# Patient Record
Sex: Male | Born: 1975
Health system: Southern US, Community
[De-identification: ages and names within clinical notes are randomized; demographics above are authoritative.]

## PROBLEM LIST (undated history)

## (undated) DIAGNOSIS — E785 Hyperlipidemia, unspecified: Secondary | ICD-10-CM

## (undated) DIAGNOSIS — A770 Spotted fever due to Rickettsia rickettsii: Secondary | ICD-10-CM

## (undated) DIAGNOSIS — D649 Anemia, unspecified: Secondary | ICD-10-CM

## (undated) DIAGNOSIS — M109 Gout, unspecified: Secondary | ICD-10-CM

## (undated) DIAGNOSIS — T7840XA Allergy, unspecified, initial encounter: Secondary | ICD-10-CM

## (undated) DIAGNOSIS — Z862 Personal history of diseases of the blood and blood-forming organs and certain disorders involving the immune mechanism: Secondary | ICD-10-CM

## (undated) DIAGNOSIS — R002 Palpitations: Secondary | ICD-10-CM

## (undated) HISTORY — DX: Palpitations: R00.2

## (undated) HISTORY — DX: Personal history of diseases of the blood and blood-forming organs and certain disorders involving the immune mechanism: Z86.2

## (undated) HISTORY — PX: KNEE SURGERY: SHX244

## (undated) HISTORY — PX: SHOULDER SURGERY: SHX246

## (undated) HISTORY — DX: Gout, unspecified: M10.9

## (undated) HISTORY — DX: Anemia, unspecified: D64.9

## (undated) HISTORY — PX: SPLENECTOMY: SUR1306

## (undated) HISTORY — DX: Allergy, unspecified, initial encounter: T78.40XA

---
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
## 2008-03-29 DIAGNOSIS — Z720 Tobacco use: Secondary | ICD-10-CM | POA: Insufficient documentation

## 2009-07-10 ENCOUNTER — Inpatient Hospital Stay: Payer: Self-pay | Admitting: Student

## 2009-08-01 DIAGNOSIS — E785 Hyperlipidemia, unspecified: Secondary | ICD-10-CM | POA: Insufficient documentation

## 2009-08-14 DIAGNOSIS — D693 Immune thrombocytopenic purpura: Secondary | ICD-10-CM | POA: Insufficient documentation

## 2010-02-20 ENCOUNTER — Ambulatory Visit: Payer: Self-pay

## 2011-03-04 ENCOUNTER — Ambulatory Visit: Payer: Self-pay | Admitting: Family Medicine

## 2011-06-27 ENCOUNTER — Ambulatory Visit: Payer: Self-pay | Admitting: Family Medicine

## 2011-08-26 LAB — CBC AND DIFFERENTIAL
HCT: 49 % (ref 41–53)
Hemoglobin: 17.5 g/dL (ref 13.5–17.5)
Neutrophils Absolute: 7 /uL
Platelets: 161 10*3/uL (ref 150–399)
WBC: 10.7 10^3/mL

## 2011-08-26 LAB — BASIC METABOLIC PANEL
BUN: 16 mg/dL (ref 4–21)
Creatinine: 1 mg/dL (ref 0.6–1.3)
Glucose: 98 mg/dL
Potassium: 4.9 mmol/L (ref 3.4–5.3)
Sodium: 138 mmol/L (ref 137–147)

## 2011-08-26 LAB — HEPATIC FUNCTION PANEL
ALT: 57 U/L — AB (ref 10–40)
AST: 35 U/L (ref 14–40)
Alkaline Phosphatase: 54 U/L (ref 25–125)
BILIRUBIN, TOTAL: 0.9 mg/dL

## 2011-08-26 LAB — TSH: TSH: 1.78 u[IU]/mL (ref 0.41–5.90)

## 2011-08-26 LAB — LIPID PANEL
Cholesterol: 266 mg/dL — AB (ref 0–200)
HDL: 48 mg/dL (ref 35–70)
LDL Cholesterol: 194 mg/dL
LDl/HDL Ratio: 4
Triglycerides: 119 mg/dL (ref 40–160)

## 2013-05-20 ENCOUNTER — Ambulatory Visit: Payer: Self-pay | Admitting: Specialist

## 2014-10-06 ENCOUNTER — Other Ambulatory Visit: Payer: Self-pay | Admitting: Family Medicine

## 2014-10-27 ENCOUNTER — Telehealth: Payer: Self-pay | Admitting: Family Medicine

## 2014-10-27 ENCOUNTER — Ambulatory Visit (INDEPENDENT_AMBULATORY_CARE_PROVIDER_SITE_OTHER): Payer: BLUE CROSS/BLUE SHIELD | Admitting: Family Medicine

## 2014-10-27 ENCOUNTER — Encounter: Payer: Self-pay | Admitting: Family Medicine

## 2014-10-27 VITALS — Temp 98.7°F

## 2014-10-27 DIAGNOSIS — Z9081 Acquired absence of spleen: Secondary | ICD-10-CM | POA: Insufficient documentation

## 2014-10-27 DIAGNOSIS — R05 Cough: Secondary | ICD-10-CM | POA: Insufficient documentation

## 2014-10-27 DIAGNOSIS — Z23 Encounter for immunization: Secondary | ICD-10-CM

## 2014-10-27 DIAGNOSIS — B029 Zoster without complications: Secondary | ICD-10-CM | POA: Insufficient documentation

## 2014-10-27 DIAGNOSIS — M25579 Pain in unspecified ankle and joints of unspecified foot: Secondary | ICD-10-CM | POA: Insufficient documentation

## 2014-10-27 DIAGNOSIS — D72829 Elevated white blood cell count, unspecified: Secondary | ICD-10-CM | POA: Insufficient documentation

## 2014-10-27 DIAGNOSIS — J189 Pneumonia, unspecified organism: Secondary | ICD-10-CM | POA: Insufficient documentation

## 2014-10-27 DIAGNOSIS — R053 Chronic cough: Secondary | ICD-10-CM | POA: Insufficient documentation

## 2014-10-27 DIAGNOSIS — G4700 Insomnia, unspecified: Secondary | ICD-10-CM | POA: Insufficient documentation

## 2014-10-27 DIAGNOSIS — M109 Gout, unspecified: Secondary | ICD-10-CM | POA: Insufficient documentation

## 2014-10-27 DIAGNOSIS — D649 Anemia, unspecified: Secondary | ICD-10-CM | POA: Insufficient documentation

## 2014-10-27 NOTE — Telephone Encounter (Signed)
Pt states per Dr Rosanna Randy he is needing to come in for 3 vaccines.  Pt is not sure of the name of the vaccines.  HT#[phone]

## 2014-10-27 NOTE — Telephone Encounter (Signed)
Pt advised need 2 meningitis immunizations and Prevnar. A year after Prevnar will need Pneumovax updated-aa

## 2014-11-08 NOTE — Telephone Encounter (Signed)
Mom wants to talk to [facility] about MMR's for the sons and has other questions about their vaccines..  Call back [phone]  [facility]

## 2014-11-08 NOTE — Telephone Encounter (Signed)
Mom is getting vaccine records from [place] central office.  Once we have them we will decide on vaccines needed.  ED

## 2014-11-28 ENCOUNTER — Ambulatory Visit: Payer: BLUE CROSS/BLUE SHIELD | Admitting: Family Medicine

## 2014-12-05 ENCOUNTER — Ambulatory Visit (INDEPENDENT_AMBULATORY_CARE_PROVIDER_SITE_OTHER): Payer: BLUE CROSS/BLUE SHIELD | Admitting: Family Medicine

## 2014-12-05 DIAGNOSIS — Z23 Encounter for immunization: Secondary | ICD-10-CM

## 2014-12-05 DIAGNOSIS — Q8901 Asplenia (congenital): Secondary | ICD-10-CM

## 2014-12-06 LAB — VARICELLA ZOSTER ABS, IGG/IGM: VARICELLA: 1825 {index} (ref >165)

## 2014-12-06 LAB — MEASLES/MUMPS/RUBELLA IMMUNITY: Rubella Antibodies, IGG: <0.9 index — ABNORMAL LOW (ref >0.99)

## 2014-12-13 ENCOUNTER — Telehealth: Payer: Self-pay | Admitting: Family Medicine

## 2014-12-13 NOTE — Telephone Encounter (Signed)
Pt is requesting the results from his blood work.  VO#[phone]

## 2014-12-13 NOTE — Telephone Encounter (Signed)
Please review, also on his brother Edison Nasuti Schnelle, same labs, thank you=-aa

## 2015-01-05 ENCOUNTER — Ambulatory Visit (INDEPENDENT_AMBULATORY_CARE_PROVIDER_SITE_OTHER): Payer: BLUE CROSS/BLUE SHIELD | Admitting: Family Medicine

## 2015-01-05 DIAGNOSIS — Z23 Encounter for immunization: Secondary | ICD-10-CM | POA: Diagnosis not present

## 2015-03-02 NOTE — Progress Notes (Signed)
Vaccine needed

## 2015-04-18 ENCOUNTER — Other Ambulatory Visit: Payer: Self-pay | Admitting: Rheumatology

## 2015-04-18 DIAGNOSIS — R945 Abnormal results of liver function studies: Principal | ICD-10-CM

## 2015-04-18 DIAGNOSIS — R7989 Other specified abnormal findings of blood chemistry: Secondary | ICD-10-CM

## 2015-04-28 ENCOUNTER — Ambulatory Visit
Admission: RE | Admit: 2015-04-28 | Discharge: 2015-04-28 | Disposition: A | Discharge status: Home / Self Care | Payer: BLUE CROSS/BLUE SHIELD | Source: Ambulatory Visit | Attending: Rheumatology | Admitting: Rheumatology

## 2015-04-28 DIAGNOSIS — R7989 Other specified abnormal findings of blood chemistry: Secondary | ICD-10-CM

## 2015-04-28 DIAGNOSIS — R945 Abnormal results of liver function studies: Principal | ICD-10-CM

## 2015-06-07 ENCOUNTER — Ambulatory Visit (INDEPENDENT_AMBULATORY_CARE_PROVIDER_SITE_OTHER): Payer: BLUE CROSS/BLUE SHIELD | Admitting: Family Medicine

## 2015-06-07 DIAGNOSIS — Z23 Encounter for immunization: Secondary | ICD-10-CM

## 2015-06-12 ENCOUNTER — Telehealth: Payer: Self-pay | Admitting: Family Medicine

## 2015-06-12 NOTE — Telephone Encounter (Signed)
Pt states he is calling [facility] back about the sleep study.  [id number]

## 2015-06-12 NOTE — Telephone Encounter (Signed)
Ryan Simmons He was checking to see if he could get the sleep study at home.  He said his insurance company seemed to be ok with it.  Can you get this set up and do I order it any differently than a regular one? Thanks [employer]

## 2015-06-15 NOTE — Telephone Encounter (Signed)
LMTCB

## 2015-06-15 NOTE — Telephone Encounter (Signed)
Pt advised that he will need to come in for office visit for documentation in order to to get sleep study approved

## 2015-06-20 ENCOUNTER — Encounter: Payer: Self-pay | Admitting: Family Medicine

## 2015-06-20 ENCOUNTER — Ambulatory Visit (INDEPENDENT_AMBULATORY_CARE_PROVIDER_SITE_OTHER): Payer: BLUE CROSS/BLUE SHIELD | Admitting: Family Medicine

## 2015-06-20 VITALS — BP 118/72 | HR 92 | Temp 98.1°F | Resp 16 | Wt 282.0 lb

## 2015-06-20 DIAGNOSIS — E669 Obesity, unspecified: Secondary | ICD-10-CM

## 2015-06-20 DIAGNOSIS — R5383 Other fatigue: Secondary | ICD-10-CM | POA: Diagnosis not present

## 2015-06-20 DIAGNOSIS — R0689 Other abnormalities of breathing: Secondary | ICD-10-CM | POA: Diagnosis not present

## 2015-06-20 DIAGNOSIS — Q8901 Asplenia (congenital): Secondary | ICD-10-CM

## 2015-06-20 DIAGNOSIS — G4730 Sleep apnea, unspecified: Secondary | ICD-10-CM | POA: Diagnosis not present

## 2015-06-20 NOTE — Progress Notes (Signed)
Patient ID: Ryan Simmons, male   DOB: Oct 30, 1975, 40 y.o.   MRN: [id number]    Subjective:  HPI  Patient is here to discuss getting referred for sleep study. His Epworth score on may 3rd was 11. He has never had a sleep study. His current symptoms that he knows off are: tired/fatigue all the time, feels sleepy all day, does not feel restful after nights sleep and wakes up gasping for air sometimes when he falls asleep in recliner.  Prior to Admission medications   Medication Sig Start Date End Date Taking? Authorizing Provider  ULORIC 80 MG TABS  05/05/15  Yes Historical Provider, MD    Patient Active Problem List   Diagnosis Date Noted  . Absolute anemia 10/27/2014  . Ankle pain 10/27/2014  . Pneumonia, organism unspecified 10/27/2014  . Cough, persistent 10/27/2014  . Gout 10/27/2014  . H/O splenectomy 10/27/2014  . Insomnia 10/27/2014  . Leukocytosis 10/27/2014  . Shingles 10/27/2014  . Immune thrombocytopenic purpura ([place]) 08/14/2009  . HLD (hyperlipidemia) 08/01/2009  . Current tobacco use 03/29/2008    Past Medical History  Diagnosis Date  . Anemia   . Allergy   . Gout     Social History   Social History  . Marital Status: Single    Spouse Name: N/A  . Number of Children: N/A  . Years of Education: N/A   Occupational History  . Not on file.   Social History Main Topics  . Smoking status: Never Smoker   . Smokeless tobacco: Current User    Types: Chew  . Alcohol Use: 0.0 oz/week    0 Standard drinks or equivalent per week     Comment: ocassionally   . Drug Use: No  . Sexual Activity: Not on file   Other Topics Concern  . Not on file   Social History Narrative    No Known Allergies  Review of Systems  Constitutional: Positive for malaise/fatigue.  HENT: Positive for congestion.   Respiratory: Positive for shortness of breath.   Cardiovascular: Negative.   Gastrointestinal: Negative.   Musculoskeletal: Negative.   Neurological: Negative.     Endo/Heme/Allergies: Negative.   Psychiatric/Behavioral: Negative.     Immunization History  Administered Date(s) Administered  . DTaP 07/15/1975, 01/02/1976, 07/20/1976, 01/23/1977  . Hepatitis A 12/05/2014  . Hepatitis A, Adult 06/07/2015  . Hepatitis B 12/05/2014  . Hepatitis B, adult 01/05/2015, 06/07/2015  . IPV 07/15/1975, 11/01/1975, 01/23/1977, 09/06/1980  . Influenza,inj,Quad PF,36+ Mos 10/27/2014  . MMR 10/02/1976  . Meningococcal B, OMV 10/27/2014, 12/05/2014  . Meningococcal Conjugate 07/12/2009, 10/27/2014  . Pneumococcal Conjugate-13 10/27/2014  . Pneumococcal Polysaccharide-23 02/18/2005, 01/05/2015  . Td 09/06/1980  . Tdap 08/29/2011   Objective:  BP 118/72 mmHg  Pulse 92  Temp(Src) 98.1 F (36.7 C)  Resp 16  Wt 282 lb (127.914 kg)  Physical Exam  Constitutional: He is oriented to person, place, and time and well-developed, well-nourished, and in no distress.  HENT:  Head: Normocephalic and atraumatic.  Left Ear: External ear normal.  Nose: Nose normal.  Mouth/Throat: Oropharynx is clear and moist.  Eyes: Conjunctivae are normal. Pupils are equal, round, and reactive to light.  Neck: Normal range of motion. Neck supple.  Cardiovascular: Normal rate, regular rhythm, normal heart sounds and intact distal pulses.   No murmur heard. Pulmonary/Chest: Effort normal and breath sounds normal. No respiratory distress. He has no wheezes.  Abdominal: Soft.  Neurological: He is alert and oriented to person, place, and time.  Gait normal.  Skin: Skin is warm and dry.  Psychiatric: Mood, memory, affect and judgment normal.      Assessment and Plan :  1. Other fatigue Epworth score is 11. Discussed benefits of doing sleep study and using CPAP machine if he has sleep apnea. Discussed 2 options of how to order this study-new way available where patient does this at home by himself and he needs tcheck with insurance for coverage, other option referring to sleep study  place and they will call patient and get all the details worked out. Patient wants to do home sleep study at this time.  2. Gasping for breath At times after falling asleep in recliner. Discussed with patient that sinus issue could be causing the symptoms he is having. Discussed option of seen Dr. Gerarda Gunther for further plan of care.  3.Obesity Work on habits. 4.Asplenic I have done the exam and reviewed the above chart and it is accurate to the best of my knowledge.  Patient was seen and examined by Dr. Eulas Post and note was scribed by Theressa Millard, RMA.    Miguel Aschoff MD [facility] 06/20/2015 2:23 PM

## 2015-06-30 DIAGNOSIS — L570 Actinic keratosis: Secondary | ICD-10-CM | POA: Diagnosis not present

## 2015-06-30 DIAGNOSIS — L718 Other rosacea: Secondary | ICD-10-CM | POA: Diagnosis not present

## 2015-07-11 ENCOUNTER — Ambulatory Visit (INDEPENDENT_AMBULATORY_CARE_PROVIDER_SITE_OTHER): Payer: BLUE CROSS/BLUE SHIELD | Admitting: Family Medicine

## 2015-07-11 ENCOUNTER — Encounter: Payer: Self-pay | Admitting: Family Medicine

## 2015-07-11 VITALS — BP 122/76 | HR 76 | Temp 97.8°F | Resp 16 | Ht 71.0 in | Wt 284.0 lb

## 2015-07-11 DIAGNOSIS — R5383 Other fatigue: Secondary | ICD-10-CM | POA: Diagnosis not present

## 2015-07-11 DIAGNOSIS — Q8901 Asplenia (congenital): Secondary | ICD-10-CM | POA: Diagnosis not present

## 2015-07-11 NOTE — Progress Notes (Signed)
Patient ID: Ryan Simmons, male   DOB: [birth year], 40 y.o.   MRN: [id number]    Subjective:  HPI Pt is here for a 1 month follow up of fatigue immunizations. Pt reports that he is still feeling tired. He had his sleep study at home and we have not received any results. He is needing a MMR vaccine today due to splenectomy.     Prior to Admission medications   Medication Sig Start Date End Date Taking? Authorizing Provider  ULORIC 80 MG TABS  05/05/15  Yes Historical Provider, MD    Patient Active Problem List   Diagnosis Date Noted  . Absolute anemia 10/27/2014  . Ankle pain 10/27/2014  . Pneumonia, organism unspecified 10/27/2014  . Cough, persistent 10/27/2014  . Gout 10/27/2014  . H/O splenectomy 10/27/2014  . Insomnia 10/27/2014  . Leukocytosis 10/27/2014  . Shingles 10/27/2014  . Immune thrombocytopenic purpura ([place]) 08/14/2009  . HLD (hyperlipidemia) 08/01/2009  . Current tobacco use 03/29/2008    Past Medical History  Diagnosis Date  . Anemia   . Allergy   . Gout     Social History   Social History  . Marital Status: Single    Spouse Name: N/A  . Number of Children: N/A  . Years of Education: N/A   Occupational History  . Not on file.   Social History Main Topics  . Smoking status: Never Smoker   . Smokeless tobacco: Current User    Types: Chew  . Alcohol Use: 0.0 oz/week    0 Standard drinks or equivalent per week     Comment: ocassionally   . Drug Use: No  . Sexual Activity: Not on file   Other Topics Concern  . Not on file   Social History Narrative    No Known Allergies  Review of Systems  Constitutional: Positive for malaise/fatigue.  HENT: Negative.   Eyes: Negative.   Respiratory: Negative.   Cardiovascular: Negative.   Gastrointestinal: Negative.   Genitourinary: Negative.   Musculoskeletal: Negative.   Skin: Negative.   Neurological: Negative.   Endo/Heme/Allergies: Negative.   Psychiatric/Behavioral: Negative.      Immunization History  Administered Date(s) Administered  . DTaP 07/15/1975, 01/02/1976, 07/20/1976, 01/23/1977  . Hepatitis A 12/05/2014  . Hepatitis A, Adult 06/07/2015  . Hepatitis B 12/05/2014  . Hepatitis B, adult 01/05/2015, 06/07/2015  . IPV 07/15/1975, 11/01/1975, 01/23/1977, 09/06/1980  . Influenza,inj,Quad PF,36+ Mos 10/27/2014  . MMR 10/02/1976  . Meningococcal B, OMV 10/27/2014, 12/05/2014  . Meningococcal Conjugate 07/12/2009, 10/27/2014  . Pneumococcal Conjugate-13 10/27/2014  . Pneumococcal Polysaccharide-23 02/18/2005, 01/05/2015  . Td 09/06/1980  . Tdap 08/29/2011   Objective:  BP 122/76 mmHg  Pulse 76  Temp(Src) 97.8 F (36.6 C) (Oral)  Resp 16  Ht '5\' 11"'$  (1.803 m)  Wt 284 lb (128.822 kg)  BMI 39.63 kg/m2  Physical Exam  Constitutional: He is oriented to person, place, and time and well-developed, well-nourished, and in no distress.  Eyes: Conjunctivae and EOM are normal. Pupils are equal, round, and reactive to light.  Neck: Normal range of motion. Neck supple.  Cardiovascular: Normal rate, regular rhythm, normal heart sounds and intact distal pulses.   Pulmonary/Chest: Effort normal and breath sounds normal.  Musculoskeletal: Normal range of motion.  Neurological: He is alert and oriented to person, place, and time. He has normal reflexes. Gait normal. GCS score is 15.  Skin: Skin is warm and dry.  Psychiatric: Mood, memory, affect and judgment normal.  Lab Results  Component Value Date   WBC 10.7 08/26/2011   HGB 17.5 08/26/2011   HCT 49 08/26/2011   PLT 161 08/26/2011   CHOL 266* 08/26/2011   TRIG 119 08/26/2011   HDL 48 08/26/2011   LDLCALC 194 08/26/2011   TSH 1.78 08/26/2011    CMP     Component Value Date/Time   NA 138 08/26/2011   K 4.9 08/26/2011   BUN 16 08/26/2011   CREATININE 1.0 08/26/2011   AST 35 08/26/2011   ALT 57* 08/26/2011   ALKPHOS 54 08/26/2011    Assessment and Plan :  1. Other fatigue   2.  Asplenia  - MMR vaccine subcutaneous; Standing - MMR vaccine subcutaneous 3. Sleep apnea Sleep study pending. Will treat as soon as sleep study is obtained  Patient was seen and examined by Dr. Miguel Aschoff, and noted scribed by Webb Laws, [place] MD [facility] 07/11/2015 4:19 PM

## 2015-08-11 NOTE — Progress Notes (Signed)
Vaccination update

## 2015-08-11 NOTE — Progress Notes (Signed)
Pt reviewed for clinical indication for vaccine.

## 2015-08-30 NOTE — Progress Notes (Signed)
Vaccine for asplenic pt.

## 2015-10-11 DIAGNOSIS — M1A09X0 Idiopathic chronic gout, multiple sites, without tophus (tophi): Secondary | ICD-10-CM | POA: Diagnosis not present

## 2015-10-11 DIAGNOSIS — Z79899 Other long term (current) drug therapy: Secondary | ICD-10-CM | POA: Diagnosis not present

## 2015-12-12 DIAGNOSIS — Z23 Encounter for immunization: Secondary | ICD-10-CM | POA: Diagnosis not present

## 2015-12-15 DIAGNOSIS — R0981 Nasal congestion: Secondary | ICD-10-CM | POA: Diagnosis not present

## 2015-12-15 DIAGNOSIS — J0100 Acute maxillary sinusitis, unspecified: Secondary | ICD-10-CM | POA: Diagnosis not present

## 2015-12-15 DIAGNOSIS — H6980 Other specified disorders of Eustachian tube, unspecified ear: Secondary | ICD-10-CM | POA: Diagnosis not present

## 2015-12-15 DIAGNOSIS — J301 Allergic rhinitis due to pollen: Secondary | ICD-10-CM | POA: Diagnosis not present

## 2015-12-25 DIAGNOSIS — J301 Allergic rhinitis due to pollen: Secondary | ICD-10-CM | POA: Diagnosis not present

## 2015-12-25 DIAGNOSIS — J342 Deviated nasal septum: Secondary | ICD-10-CM | POA: Diagnosis not present

## 2015-12-25 DIAGNOSIS — R0981 Nasal congestion: Secondary | ICD-10-CM | POA: Diagnosis not present

## 2016-03-13 ENCOUNTER — Telehealth: Payer: Self-pay | Admitting: Family Medicine

## 2016-03-13 NOTE — Telephone Encounter (Signed)
Pt's mom [name] would like [name] to return her call. [name] is concerned with pt's immune system she would like to have an Rx for Tamaflu sent to [facility] just in case. [name] stated she would just feel better if there where to start showing signs the Rx would be at the pharmacy in case it was after clinic hours or over the weekend. Please advise. Thanks TNP

## 2016-03-13 NOTE — Telephone Encounter (Signed)
Patient has not been exposed or having any symptoms, mother was just concerned and asking questions.   ED

## 2016-05-23 ENCOUNTER — Ambulatory Visit (INDEPENDENT_AMBULATORY_CARE_PROVIDER_SITE_OTHER): Payer: BLUE CROSS/BLUE SHIELD | Admitting: Family Medicine

## 2016-05-23 ENCOUNTER — Encounter: Payer: Self-pay | Admitting: Family Medicine

## 2016-05-23 VITALS — BP 122/74 | HR 92 | Temp 98.3°F | Resp 16 | Ht 73.0 in | Wt 281.0 lb

## 2016-05-23 DIAGNOSIS — Z9081 Acquired absence of spleen: Secondary | ICD-10-CM | POA: Diagnosis not present

## 2016-05-23 DIAGNOSIS — M109 Gout, unspecified: Secondary | ICD-10-CM

## 2016-05-23 DIAGNOSIS — Z23 Encounter for immunization: Secondary | ICD-10-CM

## 2016-05-23 DIAGNOSIS — Z0000 Encounter for general adult medical examination without abnormal findings: Secondary | ICD-10-CM

## 2016-05-23 LAB — POCT URINALYSIS DIPSTICK
Bilirubin, UA: NEGATIVE
Blood, UA: NEGATIVE
Glucose, UA: NEGATIVE
Ketones, UA: NEGATIVE
Leukocytes, UA: NEGATIVE
Nitrite, UA: NEGATIVE
Protein, UA: NEGATIVE
Spec Grav, UA: 1.01
Urobilinogen, UA: 0.2 U/dL
pH, UA: 6

## 2016-05-23 NOTE — Progress Notes (Signed)
Patient: Ryan Simmons, Male    DOB: [birth year], 41 y.o.   MRN: [id number] Visit Date: 05/23/2016  Today's Provider: Wilhemena Durie, MD   Chief Complaint  Patient presents with  . Annual Exam   Subjective:    Annual physical exam Ryan Simmons is a 41 y.o. male who presents today for health maintenance and complete physical. He feels well. He reports exercising about twice a week for 20 minutes. Walks. He reports he is sleeping well.  Pt also has a H/O splenectomy, and needs his hib vaccine today. Pt needs routine labs today. -----------------------------------------------------------------   Review of Systems  Constitutional: Negative.   HENT: Negative.   Eyes: Negative.   Respiratory: Negative.   Cardiovascular: Negative.   Gastrointestinal: Negative.   Endocrine: Negative.   Genitourinary: Negative.   Musculoskeletal: Positive for arthralgias and back pain. Negative for gait problem, joint swelling, myalgias, neck pain and neck stiffness.  Skin: Negative.   Allergic/Immunologic: Negative.   Neurological: Negative.   Hematological: Negative.   Psychiatric/Behavioral: Negative.     Social History      He  reports that he has never smoked. His smokeless tobacco use includes Chew. He reports that he drinks alcohol. He reports that he does not use drugs.       Social History   Social History  . Marital status: Single    Spouse name: N/A  . Number of children: 0  . Years of education: bachelors   Occupational History  .  [employer]   Social History Main Topics  . Smoking status: Never Smoker  . Smokeless tobacco: Current User    Types: Chew  . Alcohol use 0.0 oz/week     Comment: ocassionally   . Drug use: No  . Sexual activity: Yes   Other Topics Concern  . None   Social History Narrative  . None    Past Medical History:  Diagnosis Date  . Allergy   . Anemia   . Gout      Patient Active Problem List   Diagnosis Date  Noted  . Absolute anemia 10/27/2014  . Ankle pain 10/27/2014  . Pneumonia, organism unspecified(486) 10/27/2014  . Cough, persistent 10/27/2014  . Gout 10/27/2014  . H/O splenectomy 10/27/2014  . Insomnia 10/27/2014  . Leukocytosis 10/27/2014  . Shingles 10/27/2014  . Immune thrombocytopenic purpura ([place]) 08/14/2009  . HLD (hyperlipidemia) 08/01/2009  . Current tobacco use 03/29/2008    Past Surgical History:  Procedure Laterality Date  . KNEE SURGERY Left   . SHOULDER SURGERY Right   . SPLENECTOMY      Family History        Family Status  Relation Status  . Father Alive  . Mother Alive  . Brother Alive        His family history includes Gout in his father; Healthy in his brother and mother.     No Known Allergies   Current Outpatient Prescriptions:  .  ULORIC 80 MG TABS, , Disp: , Rfl: 0   Patient Care Team: Jerrol Banana., MD as PCP - General (Family Medicine)      Objective:   Vitals: BP 122/74 (BP Location: Right Arm, Patient Position: Sitting, Cuff Size: Large)   Pulse 92   Temp 98.3 F (36.8 C) (Oral)   Resp 16   Ht _0  (1.854 m)   Wt 281 lb (127.5 kg)   BMI 37.07 kg/m  Vitals:   05/23/16 0925  BP: 122/74  Pulse: 92  Resp: 16  Temp: 98.3 F (36.8 C)  TempSrc: Oral  Weight: 281 lb (127.5 kg)  Height: _0  (1.854 m)     Physical Exam  Constitutional: He is oriented to person, place, and time. He appears well-developed and well-nourished.  HENT:  Head: Normocephalic and atraumatic.  Right Ear: External ear normal.  Left Ear: External ear normal.  Nose: Nose normal.  Mouth/Throat: Oropharynx is clear and moist. No oropharyngeal exudate.  Tonsils enlarged--2+.  Eyes: Conjunctivae are normal. Pupils are equal, round, and reactive to light. Right eye exhibits no discharge. Left eye exhibits no discharge.  Neck: Normal range of motion. Neck supple. No thyromegaly present.  Cardiovascular: Normal rate, regular rhythm and normal  heart sounds.   Pulmonary/Chest: Effort normal. No respiratory distress.  Abdominal: Soft. There is no tenderness.  Genitourinary: Penis normal.  Genitourinary Comments: DRE refused.  Musculoskeletal: Normal range of motion. He exhibits no edema.  Lymphadenopathy:    He has no cervical adenopathy.  Neurological: He is alert and oriented to person, place, and time. He has normal reflexes.  Skin: Skin is warm and dry. No rash noted. He is not diaphoretic. No erythema. No pallor.  Psychiatric: He has a normal mood and affect. His behavior is normal. Judgment and thought content normal.     Depression Screen PHQ 2/9 Scores 05/23/2016  PHQ - 2 Score 0  PHQ- 9 Score 0      Assessment & Plan:     Routine Health Maintenance and Physical Exam  Exercise Activities and Dietary recommendations Goals    None      Immunization History  Administered Date(s) Administered  . DTaP 07/15/1975, 01/02/1976, 07/20/1976, 01/23/1977  . Hepatitis A 12/05/2014  . Hepatitis A, Adult 06/07/2015  . Hepatitis B 12/05/2014  . Hepatitis B, adult 01/05/2015, 06/07/2015  . HiB (PRP-T) 05/23/2016  . IPV 07/15/1975, 11/01/1975, 01/23/1977, 09/06/1980  . Influenza,inj,Quad PF,36+ Mos 10/27/2014  . MMR 10/02/1976, 07/11/2015  . Meningococcal B, OMV 10/27/2014, 12/05/2014  . Meningococcal Conjugate 07/12/2009, 10/27/2014  . Pneumococcal Conjugate-13 10/27/2014  . Pneumococcal Polysaccharide-23 02/18/2005, 01/05/2015  . Td 09/06/1980  . Tdap 08/29/2011    Health Maintenance  Topic Date Due  . HIV Screening  05/13/1990  . INFLUENZA VACCINE  09/04/2016  . TETANUS/TDAP  08/28/2021     Discussed health benefits of physical activity, and encouraged him to engage in regular exercise appropriate for his age and condition.  Mild Obesity s/p Splenectomy for ITP Gout Check Uric Acid.    --------------------------------------------------------------------  Patient seen and examined by Miguel Aschoff, MD, and note scribed by Renaldo Fiddler, CMA. I have done the exam and reviewed the above chart and it is accurate to the best of my knowledge. Development worker, community has been used in this note in any air is in the dictation or transcription are unintentional.  Wilhemena Durie, MD  [place]

## 2016-05-24 LAB — CBC WITH DIFFERENTIAL/PLATELET
Basophils Absolute: 0.1 10*3/uL (ref 0.0–0.2)
Basos: 1 %
EOS (ABSOLUTE): 0.1 10*3/uL (ref 0.0–0.4)
EOS: 2 %
HEMATOCRIT: 46.8 % (ref 37.5–51.0)
HEMOGLOBIN: 16.5 g/dL (ref 13.0–17.7)
IMMATURE GRANS (ABS): 0 10*3/uL (ref 0.0–0.1)
Immature Granulocytes: 0 %
LYMPHS ABS: 2 10*3/uL (ref 0.7–3.1)
LYMPHS: 27 %
MCH: 28.9 pg (ref 26.6–33.0)
MCHC: 35.3 g/dL (ref 31.5–35.7)
MCV: 82 fL (ref 79–97)
MONOCYTES: 9 %
Monocytes Absolute: 0.7 10*3/uL (ref 0.1–0.9)
NEUTROS ABS: 4.6 10*3/uL (ref 1.4–7.0)
Neutrophils: 61 %
Platelets: 142 10*3/uL — ABNORMAL LOW (ref 150–379)
RBC: 5.71 x10E6/uL (ref 4.14–5.80)
RDW: 14.4 % (ref 12.3–15.4)
WBC: 7.4 10*3/uL (ref 3.4–10.8)

## 2016-05-24 LAB — COMPREHENSIVE METABOLIC PANEL
ALBUMIN: 4.8 g/dL (ref 3.5–5.5)
ALK PHOS: 62 IU/L (ref 39–117)
ALT: 34 IU/L (ref 0–44)
AST: 24 IU/L (ref 0–40)
Albumin/Globulin Ratio: 1.8 (ref 1.2–2.2)
BILIRUBIN TOTAL: 0.7 mg/dL (ref 0.0–1.2)
BUN / CREAT RATIO: 12 (ref 9–20)
BUN: 12 mg/dL (ref 6–24)
CO2: 23 mmol/L (ref 18–29)
CREATININE: 0.99 mg/dL (ref 0.76–1.27)
Calcium: 10 mg/dL (ref 8.7–10.2)
Chloride: 100 mmol/L (ref 96–106)
GFR calc non Af Amer: 94 mL/min/{1.73_m2} (ref >59)
GFR, EST AFRICAN AMERICAN: 109 mL/min/{1.73_m2} (ref >59)
GLOBULIN, TOTAL: 2.7 g/dL (ref 1.5–4.5)
GLUCOSE: 105 mg/dL — AB (ref 65–99)
Potassium: 4.6 mmol/L (ref 3.5–5.2)
SODIUM: 141 mmol/L (ref 134–144)
TOTAL PROTEIN: 7.5 g/dL (ref 6.0–8.5)

## 2016-05-24 LAB — LIPID PANEL
Chol/HDL Ratio: 9.4 ratio — ABNORMAL HIGH (ref 0.0–5.0)
Cholesterol, Total: 272 mg/dL — ABNORMAL HIGH (ref 100–199)
HDL: 29 mg/dL — AB (ref >39)
LDL Calculated: 196 mg/dL — ABNORMAL HIGH (ref 0–99)
TRIGLYCERIDES: 236 mg/dL — AB (ref 0–149)
VLDL Cholesterol Cal: 47 mg/dL — ABNORMAL HIGH (ref 5–40)

## 2016-05-24 LAB — TSH: TSH: 2.3 u[IU]/mL (ref 0.450–4.500)

## 2016-05-24 LAB — PSA: PROSTATE SPECIFIC AG, SERUM: 0.5 ng/mL (ref 0.0–4.0)

## 2016-05-24 LAB — URIC ACID: Uric Acid: 7.7 mg/dL (ref 3.7–8.6)

## 2016-06-03 ENCOUNTER — Telehealth: Payer: Self-pay

## 2016-06-03 MED ORDER — ROSUVASTATIN CALCIUM 10 MG PO TABS: 10.0000 mg | ORAL_TABLET | Freq: Every day | ORAL | 3 refills | Status: DC

## 2016-06-03 NOTE — Telephone Encounter (Signed)
-----   Message from Jerrol Banana., MD sent at 06/03/2016  3:23 PM EDT ----- Labs ok but lipids too high--would start c/restor 10mg  dailt--RTC 1 month

## 2016-06-03 NOTE — Telephone Encounter (Signed)
Patient advised. RX sent in to [facility] and appointment made for follow up-aa

## 2016-07-02 ENCOUNTER — Encounter: Payer: Self-pay | Admitting: Family Medicine

## 2016-07-02 ENCOUNTER — Ambulatory Visit (INDEPENDENT_AMBULATORY_CARE_PROVIDER_SITE_OTHER): Payer: BLUE CROSS/BLUE SHIELD | Admitting: Family Medicine

## 2016-07-02 VITALS — BP 122/72 | HR 82 | Temp 98.6°F | Resp 16 | Wt 286.0 lb

## 2016-07-02 DIAGNOSIS — E7800 Pure hypercholesterolemia, unspecified: Secondary | ICD-10-CM

## 2016-07-02 NOTE — Progress Notes (Signed)
Subjective:  HPI  Lipid/Cholesterol, Follow-up:   Last seen for this1 months ago.  Management changes since that visit include started crestor. . Last Lipid Panel:    Component Value Date/Time   CHOL 272 (H) 05/23/2016 1043   TRIG 236 (H) 05/23/2016 1043   HDL 29 (L) 05/23/2016 1043   CHOLHDL 9.4 (H) 05/23/2016 1043   LDLCALC 196 (H) 05/23/2016 1043    Risk factors for vascular disease include hypercholesterolemia He has no side effects with medication. He reports good compliance with treatment. He is not having side effects.  Current exercise: none  Wt Readings from Last 3 Encounters:  07/02/16 286 lb (129.7 kg)  05/23/16 281 lb (127.5 kg)  07/11/15 284 lb (128.8 kg)   ------------------------------------------------------------------- Pt reports that he his doing well on Crestor. He is due for a recheck of lipids.   Prior to Admission medications   Medication Sig Start Date End Date Taking? Authorizing Provider  rosuvastatin (CRESTOR) 10 MG tablet Take 1 tablet (10 mg total) by mouth at bedtime. 06/03/16   Jerrol Banana., MD  ULORIC 80 MG TABS  05/05/15   [provider]    Patient Active Problem List   Diagnosis Date Noted  . Absolute anemia 10/27/2014  . Ankle pain 10/27/2014  . Pneumonia, organism unspecified(486) 10/27/2014  . Cough, persistent 10/27/2014  . Gout 10/27/2014  . H/O splenectomy 10/27/2014  . Insomnia 10/27/2014  . Leukocytosis 10/27/2014  . Shingles 10/27/2014  . Immune thrombocytopenic purpura ([place]) 08/14/2009  . HLD (hyperlipidemia) 08/01/2009  . Current tobacco use 03/29/2008    Past Medical History:  Diagnosis Date  . Allergy   . Anemia   . Gout     Social History   Social History  . Marital status: Single    Spouse name: N/A  . Number of children: 0  . Years of education: bachelors   Occupational History  .  [employer]   Social History Main Topics  . Smoking status: Never Smoker  . Smokeless  tobacco: Current User    Types: Chew  . Alcohol use 0.0 oz/week     Comment: ocassionally   . Drug use: No  . Sexual activity: Yes   Other Topics Concern  . Not on file   Social History Narrative  . No narrative on file    No Known Allergies  Review of Systems  Constitutional: Negative.   HENT: Negative.   Eyes: Negative.   Respiratory: Negative.   Cardiovascular: Negative.   Gastrointestinal: Negative.   Genitourinary: Negative.   Musculoskeletal: Negative.   Skin: Negative.   Neurological: Negative.   Endo/Heme/Allergies: Negative.   Psychiatric/Behavioral: Negative.     Immunization History  Administered Date(s) Administered  . DTaP 07/15/1975, 01/02/1976, 07/20/1976, 01/23/1977  . Hepatitis A 12/05/2014  . Hepatitis A, Adult 06/07/2015  . Hepatitis B 12/05/2014  . Hepatitis B, adult 01/05/2015, 06/07/2015  . HiB (PRP-T) 05/23/2016  . IPV 07/15/1975, 11/01/1975, 01/23/1977, 09/06/1980  . Influenza,inj,Quad PF,36+ Mos 10/27/2014  . MMR 10/02/1976, 07/11/2015  . Meningococcal B, OMV 10/27/2014, 12/05/2014  . Meningococcal Conjugate 07/12/2009, 10/27/2014  . Pneumococcal Conjugate-13 10/27/2014  . Pneumococcal Polysaccharide-23 02/18/2005, 01/05/2015  . Td 09/06/1980  . Tdap 08/29/2011    Objective:  BP 122/72 (BP Location: Right Arm, Patient Position: Sitting, Cuff Size: Large)   Pulse 82   Temp 98.6 F (37 C) (Oral)   Resp 16   Wt 286 lb (129.7 kg)   BMI 37.73 kg/m  Physical Exam  Constitutional: He is oriented to person, place, and time and well-developed, well-nourished, and in no distress.  HENT:  Head: Normocephalic and atraumatic.  Eyes: Conjunctivae are normal. No scleral icterus.  Neck: No thyromegaly present.  Cardiovascular: Normal rate, regular rhythm and normal heart sounds.   Pulmonary/Chest: Effort normal and breath sounds normal.  Neurological: He is oriented to person, place, and time. Gait normal. GCS score is 15.  Skin: Skin is  warm and dry.  Psychiatric: Mood, memory, affect and judgment normal.    Lab Results  Component Value Date   WBC 7.4 05/23/2016   HGB 17.5 08/26/2011   HCT 46.8 05/23/2016   PLT 142 (L) 05/23/2016   GLUCOSE 105 (H) 05/23/2016   CHOL 272 (H) 05/23/2016   TRIG 236 (H) 05/23/2016   HDL 29 (L) 05/23/2016   LDLCALC 196 (H) 05/23/2016   TSH 2.300 05/23/2016    CMP     Component Value Date/Time   NA 141 05/23/2016 1043   K 4.6 05/23/2016 1043   CL 100 05/23/2016 1043   CO2 23 05/23/2016 1043   GLUCOSE 105 (H) 05/23/2016 1043   BUN 12 05/23/2016 1043   CREATININE 0.99 05/23/2016 1043   CALCIUM 10.0 05/23/2016 1043   PROT 7.5 05/23/2016 1043   ALBUMIN 4.8 05/23/2016 1043   AST 24 05/23/2016 1043   ALT 34 05/23/2016 1043   ALKPHOS 62 05/23/2016 1043   BILITOT 0.7 05/23/2016 1043   GFRNONAA 94 05/23/2016 1043   GFRAA 109 05/23/2016 1043    Assessment and Plan :  1. Pure hypercholesterolemia  - Lipid Panel With LDL/HDL Ratio - Hepatic function panel - CK 2.Gout  I have done the exam and reviewed the chart and it is accurate to the best of my knowledge. Development worker, community has been used and  any errors in dictation or transcription are unintentional. Miguel Aschoff M.D. [place] MD [facility] 07/02/2016 8:42 AM

## 2016-07-03 LAB — HEPATIC FUNCTION PANEL
ALT: 48 [IU]/L — ABNORMAL HIGH (ref 0–44)
AST: 33 [IU]/L (ref 0–40)
Albumin: 4.2 g/dL (ref 3.5–5.5)
Alkaline Phosphatase: 59 [IU]/L (ref 39–117)
Bilirubin Total: 0.5 mg/dL (ref 0.0–1.2)
Bilirubin, Direct: 0.15 mg/dL (ref 0.00–0.40)
Total Protein: 6.8 g/dL (ref 6.0–8.5)

## 2016-07-03 LAB — CK: Total CK: 178 U/L (ref 24–204)

## 2016-07-03 LAB — LIPID PANEL WITH LDL/HDL RATIO
Cholesterol, Total: 198 mg/dL (ref 100–199)
HDL: 20 mg/dL — ABNORMAL LOW
Triglycerides: 1152 mg/dL (ref 0–149)

## 2016-07-05 ENCOUNTER — Telehealth: Payer: Self-pay

## 2016-07-05 DIAGNOSIS — E7849 Other hyperlipidemia: Secondary | ICD-10-CM

## 2016-07-05 MED ORDER — OMEGA-3-ACID ETHYL ESTERS 1 G PO CAPS: ORAL_CAPSULE | ORAL | 12 refills | Status: DC

## 2016-07-05 NOTE — Telephone Encounter (Signed)
-----   Message from Jerrol Banana., MD sent at 07/03/2016  9:06 AM EDT ----- Triglycerides way too high if this was fasting lab work. Recommend generic Fish oil 1 g, 4 daily. #120 with 12 refills. Repeat lipids one month

## 2016-07-05 NOTE — Telephone Encounter (Signed)
Pt advised, RX sent in and lab slip placed up front-aa

## 2016-10-10 DIAGNOSIS — Z79899 Other long term (current) drug therapy: Secondary | ICD-10-CM | POA: Diagnosis not present

## 2016-10-10 DIAGNOSIS — M1A09X0 Idiopathic chronic gout, multiple sites, without tophus (tophi): Secondary | ICD-10-CM | POA: Diagnosis not present

## 2016-10-10 DIAGNOSIS — K760 Fatty (change of) liver, not elsewhere classified: Secondary | ICD-10-CM | POA: Diagnosis not present

## 2016-10-30 DIAGNOSIS — L570 Actinic keratosis: Secondary | ICD-10-CM | POA: Diagnosis not present

## 2016-10-30 DIAGNOSIS — L820 Inflamed seborrheic keratosis: Secondary | ICD-10-CM | POA: Diagnosis not present

## 2016-12-09 ENCOUNTER — Ambulatory Visit: Payer: BLUE CROSS/BLUE SHIELD | Admitting: Family Medicine

## 2016-12-10 ENCOUNTER — Ambulatory Visit: Payer: BLUE CROSS/BLUE SHIELD | Admitting: Family Medicine

## 2016-12-10 VITALS — BP 132/70 | HR 84 | Temp 98.1°F | Resp 18 | Wt 284.0 lb

## 2016-12-10 DIAGNOSIS — Z6837 Body mass index (BMI) 37.0-37.9, adult: Secondary | ICD-10-CM | POA: Diagnosis not present

## 2016-12-10 DIAGNOSIS — Z9081 Acquired absence of spleen: Secondary | ICD-10-CM

## 2016-12-10 DIAGNOSIS — M791 Myalgia, unspecified site: Secondary | ICD-10-CM | POA: Diagnosis not present

## 2016-12-10 DIAGNOSIS — R739 Hyperglycemia, unspecified: Secondary | ICD-10-CM

## 2016-12-10 DIAGNOSIS — M109 Gout, unspecified: Secondary | ICD-10-CM | POA: Diagnosis not present

## 2016-12-10 DIAGNOSIS — E7849 Other hyperlipidemia: Secondary | ICD-10-CM

## 2016-12-10 DIAGNOSIS — Z23 Encounter for immunization: Secondary | ICD-10-CM | POA: Diagnosis not present

## 2016-12-10 MED ORDER — MAGNESIUM OXIDE 400 MG PO TABS: 400.0000 mg | ORAL_TABLET | Freq: Two times a day (BID) | ORAL | 2 refills | Status: DC

## 2016-12-10 NOTE — Progress Notes (Signed)
Ryan Simmons  MRN: [id number] DOB: 1975/06/20  Subjective:  HPI  Patient is here for 6 months follow up. Last office visit was on 07/02/16. Last routine lab work was done on 07/02/16 and Triglycerides were too high-patient was started on Fish oil 1 gram 4 daily at that time. Patient is also taking Crestor 10 mg but has not been in routine on this. Patient did not get repeat lab done after adding Fish oil  Lab Results  Component Value Date   CHOL 198 07/02/2016   HDL 20 (L) 07/02/2016   LDLCALC Comment 07/02/2016   TRIG 1,152 (HH) 07/02/2016   CHOLHDL 9.4 (H) 05/23/2016   Wt Readings from Last 3 Encounters:  12/10/16 284 lb (128.8 kg)  07/02/16 286 lb (129.7 kg)  05/23/16 281 lb (127.5 kg)   GOUT: patient is taking Uloric and has not had a flare up. Patient has noticed cramping a lot in his hands and feet and some night cramp in right calf.  Patient Active Problem List   Diagnosis Date Noted  . Absolute anemia 10/27/2014  . Ankle pain 10/27/2014  . Pneumonia, organism unspecified(486) 10/27/2014  . Cough, persistent 10/27/2014  . Gout 10/27/2014  . H/O splenectomy 10/27/2014  . Insomnia 10/27/2014  . Leukocytosis 10/27/2014  . Shingles 10/27/2014  . Immune thrombocytopenic purpura ([place]) 08/14/2009  . HLD (hyperlipidemia) 08/01/2009  . Current tobacco use 03/29/2008    Past Medical History:  Diagnosis Date  . Allergy   . Anemia   . Gout     Social History   Socioeconomic History  . Marital status: Single    Spouse name: Not on file  . Number of children: 0  . Years of education: bachelors  . Highest education level: Not on file  Social Needs  . Financial resource strain: Not on file  . Food insecurity - worry: Not on file  . Food insecurity - inability: Not on file  . Transportation needs - medical: Not on file  . Transportation needs - non-medical: Not on file  Occupational History    Employer: [employer]  Tobacco Use  . Smoking status: Never  Smoker  . Smokeless tobacco: Current User    Types: Chew  Substance and Sexual Activity  . Alcohol use: Yes    Alcohol/week: 0.0 oz    Comment: ocassionally   . Drug use: No  . Sexual activity: Yes  Other Topics Concern  . Not on file  Social History Narrative  . Not on file    Outpatient Encounter Medications as of 12/10/2016  Medication Sig Note  . omega-3 acid ethyl esters (LOVAZA) 1 g capsule Take 4 capsules daily   . rosuvastatin (CRESTOR) 10 MG tablet Take 1 tablet (10 mg total) by mouth at bedtime.   Marland Kitchen ULORIC 80 MG TABS  06/20/2015: Received from: External Pharmacy   No facility-administered encounter medications on file as of 12/10/2016.     No Known Allergies  Review of Systems  Constitutional: Negative.   Eyes: Negative.   Respiratory: Negative.   Cardiovascular: Negative.   Gastrointestinal: Negative.   Musculoskeletal: Positive for joint pain and myalgias.  Skin: Negative.   Neurological: Negative.   Endo/Heme/Allergies: Negative.   Psychiatric/Behavioral: Negative.     Objective:  BP 132/70   Pulse 84   Temp 98.1 F (36.7 C)   Resp 18   Wt 284 lb (128.8 kg)   BMI 37.47 kg/m   Physical Exam  Constitutional: He is oriented to  person, place, and time and well-developed, well-nourished, and in no distress.  HENT:  Head: Normocephalic and atraumatic.  Eyes: Conjunctivae are normal. Pupils are equal, round, and reactive to light. No scleral icterus.  Neck: Normal range of motion. No thyromegaly present.  Cardiovascular: Normal rate, regular rhythm, normal heart sounds and intact distal pulses. Exam reveals no gallop.  No murmur heard. Pulmonary/Chest: Effort normal and breath sounds normal. No respiratory distress. He has no wheezes.  Musculoskeletal: He exhibits no edema or tenderness.  Neurological: He is alert and oriented to person, place, and time. He displays normal reflexes. No cranial nerve deficit.  Skin: Skin is warm and dry.  Psychiatric:  Mood, memory, affect and judgment normal.   Assessment and Plan :  1. Other hyperlipidemia Advised patient to take all medications regularly the next 2 to 3 weeks and re check labs at that time. Discussed lipid levels and if the triglycerides levels are over 1,000 will need to refer patient to Lipid specialist. - Lipid Profile - Comprehensive metabolic panel  2. H/O splenectomy 3. Gout, unspecified cause, unspecified chronicity, unspecified site Stable.  4. Need for immunization against influenza - Flu Vaccine QUAD 36+ mos IM  5. BMI 37.0-37.9, adult Discussed with patient to work on habits.  6. Hyperglycemia - HgB A1c  7. Myalgia Will check lab work, advised patient to try MagOx twice daily. Follow. - CK (Creatine Kinase) - CBC with Differential/Platelet - magnesium oxide (MAG-OX) 400 MG tablet; Take 1 tablet (400 mg total) 2 (two) times daily by mouth.  Dispense: 60 tablet; Refill: 2  HPI, Exam and A&P transcribed by Tiffany Kocher, RMA under direction and in the presence of Miguel Aschoff, MD. I have done the exam and reviewed the chart and it is accurate to the best of my knowledge. Development worker, community has been used and  any errors in dictation or transcription are unintentional. Miguel Aschoff M.D. [facility]

## 2017-01-08 DIAGNOSIS — R739 Hyperglycemia, unspecified: Secondary | ICD-10-CM | POA: Diagnosis not present

## 2017-01-08 DIAGNOSIS — M791 Myalgia, unspecified site: Secondary | ICD-10-CM | POA: Diagnosis not present

## 2017-01-08 DIAGNOSIS — E7849 Other hyperlipidemia: Secondary | ICD-10-CM | POA: Diagnosis not present

## 2017-01-09 LAB — LIPID PANEL
CHOLESTEROL: 156 mg/dL (ref <200)
HDL: 29 mg/dL — AB (ref >40)
LDL Cholesterol (Calc): 97 mg/dL (calc)
Non-HDL Cholesterol (Calc): 127 mg/dL (calc) (ref <130)
Total CHOL/HDL Ratio: 5.4 (calc) — ABNORMAL HIGH (ref <5.0)
Triglycerides: 204 mg/dL — ABNORMAL HIGH (ref <150)

## 2017-01-09 LAB — CK: Total CK: 197 U/L — ABNORMAL HIGH (ref 44–196)

## 2017-01-09 LAB — CBC WITH DIFFERENTIAL/PLATELET
BASOS PCT: 0.7 %
Basophils Absolute: 48 cells/uL (ref 0–200)
Eosinophils Absolute: 129 cells/uL (ref 15–500)
Eosinophils Relative: 1.9 %
HEMATOCRIT: 45.6 % (ref 38.5–50.0)
HEMOGLOBIN: 15.8 g/dL (ref 13.2–17.1)
LYMPHS ABS: 1693 {cells}/uL (ref 850–3900)
MCH: 28.7 pg (ref 27.0–33.0)
MCHC: 34.6 g/dL (ref 32.0–36.0)
MCV: 82.8 fL (ref 80.0–100.0)
MPV: 8.6 fL (ref 7.5–12.5)
Monocytes Relative: 12.1 %
Neutro Abs: 4107 cells/uL (ref 1500–7800)
Neutrophils Relative %: 60.4 %
Platelets: 152 10*3/uL (ref 140–400)
RBC: 5.51 10*6/uL (ref 4.20–5.80)
RDW: 13.1 % (ref 11.0–15.0)
Total Lymphocyte: 24.9 %
WBC: 6.8 10*3/uL (ref 3.8–10.8)
WBCMIX: 823 {cells}/uL (ref 200–950)

## 2017-01-09 LAB — COMPLETE METABOLIC PANEL WITH GFR
AG RATIO: 1.6 (calc) (ref 1.0–2.5)
ALBUMIN MSPROF: 4.4 g/dL (ref 3.6–5.1)
ALKALINE PHOSPHATASE (APISO): 55 U/L (ref 40–115)
ALT: 47 U/L — AB (ref 9–46)
AST: 30 U/L (ref 10–40)
BUN: 15 mg/dL (ref 7–25)
CO2: 27 mmol/L (ref 20–32)
Calcium: 9.1 mg/dL (ref 8.6–10.3)
Chloride: 104 mmol/L (ref 98–110)
Creat: 0.92 mg/dL (ref 0.60–1.35)
GFR, EST NON AFRICAN AMERICAN: 103 mL/min/{1.73_m2} (ref >=60)
GFR, Est African American: 119 mL/min/{1.73_m2} (ref >=60)
GLOBULIN: 2.7 g/dL (ref 1.9–3.7)
Glucose, Bld: 121 mg/dL — ABNORMAL HIGH (ref 65–99)
POTASSIUM: 4.2 mmol/L (ref 3.5–5.3)
Sodium: 138 mmol/L (ref 135–146)
TOTAL PROTEIN: 7.1 g/dL (ref 6.1–8.1)
Total Bilirubin: 0.6 mg/dL (ref 0.2–1.2)

## 2017-01-09 LAB — HEMOGLOBIN A1C
Hgb A1c MFr Bld: 5.7 % of total Hgb — ABNORMAL HIGH (ref <5.7)
MEAN PLASMA GLUCOSE: 117 (calc)
eAG (mmol/L): 6.5 (calc)

## 2017-04-09 ENCOUNTER — Ambulatory Visit: Payer: Self-pay | Admitting: Family Medicine

## 2017-06-06 ENCOUNTER — Other Ambulatory Visit: Payer: Self-pay | Admitting: Family Medicine

## 2017-06-06 NOTE — Telephone Encounter (Signed)
Pharmacy requesting refills. Thanks!  

## 2017-07-09 ENCOUNTER — Telehealth: Payer: Self-pay | Admitting: Family Medicine

## 2017-07-09 ENCOUNTER — Telehealth: Payer: Self-pay | Admitting: Emergency Medicine

## 2017-07-09 ENCOUNTER — Ambulatory Visit (INDEPENDENT_AMBULATORY_CARE_PROVIDER_SITE_OTHER): Payer: BLUE CROSS/BLUE SHIELD | Admitting: Family Medicine

## 2017-07-09 VITALS — BP 130/78 | HR 84 | Temp 98.0°F | Resp 16 | Wt 268.0 lb

## 2017-07-09 DIAGNOSIS — Z9081 Acquired absence of spleen: Secondary | ICD-10-CM | POA: Diagnosis not present

## 2017-07-09 DIAGNOSIS — K625 Hemorrhage of anus and rectum: Secondary | ICD-10-CM

## 2017-07-09 DIAGNOSIS — D693 Immune thrombocytopenic purpura: Secondary | ICD-10-CM | POA: Diagnosis not present

## 2017-07-09 DIAGNOSIS — E7849 Other hyperlipidemia: Secondary | ICD-10-CM | POA: Diagnosis not present

## 2017-07-09 NOTE — Telephone Encounter (Signed)
LMT[facility] ED 

## 2017-07-09 NOTE — Telephone Encounter (Signed)
Son has an appt this affternoon.  Mom wanted to talk to [place] before he came in [phone]  [facility]

## 2017-07-09 NOTE — Telephone Encounter (Signed)
FYI: Pt called stating that he was having rectal bleeding. He reports that he had bright red blood in the toilet, when he wiped and he was having to sit on a towel. He is not having any other GI symptoms. Made pt an appt for 2:40 today as he only want to see Dr. Rosanna Randy. He was informed if he started having other GI symptoms or the bleeding got worse to go to the ER. Pt voiced understanding.

## 2017-07-09 NOTE — Progress Notes (Signed)
Ryan Simmons  MRN: [id number] DOB: [birth year]  Subjective:  HPI   The patient is a 42 year old male who presents today for evaluation of rectal bleeding of bright red blood.  He states that he was sitting last night when he suddenly passed blood enough to soak through his clothing and onto the chair he was sitting in.  He has not had any pain, fever nausea or other associated symptoms.   He has had no constipation or straining with bowel movements and states that it is a little better today.  His last bowel movement was about 1 hour prior to his visit today.   Has not had this happen before.   Patient Active Problem List   Diagnosis Date Noted  . Absolute anemia 10/27/2014  . Ankle pain 10/27/2014  . Pneumonia, organism unspecified(486) 10/27/2014  . Cough, persistent 10/27/2014  . Gout 10/27/2014  . H/O splenectomy 10/27/2014  . Insomnia 10/27/2014  . Leukocytosis 10/27/2014  . Shingles 10/27/2014  . Immune thrombocytopenic purpura ([place]) 08/14/2009  . HLD (hyperlipidemia) 08/01/2009  . Current tobacco use 03/29/2008    Past Medical History:  Diagnosis Date  . Allergy   . Anemia   . Gout     Social History   Socioeconomic History  . Marital status: Single    Spouse name: Not on file  . Number of children: 0  . Years of education: bachelors  . Highest education level: Not on file  Occupational History    Employer: [employer]  Social Needs  . Financial resource strain: Not on file  . Food insecurity:    Worry: Not on file    Inability: Not on file  . Transportation needs:    Medical: Not on file    Non-medical: Not on file  Tobacco Use  . Smoking status: Never Smoker  . Smokeless tobacco: Current User    Types: Chew  Substance and Sexual Activity  . Alcohol use: Yes    Alcohol/week: 0.0 oz    Comment: ocassionally   . Drug use: No  . Sexual activity: Yes  Lifestyle  . Physical activity:    Days per week: Not on file    Minutes per session: Not  on file  . Stress: Not on file  Relationships  . Social connections:    Talks on phone: Not on file    Gets together: Not on file    Attends religious service: Not on file    Active member of club or organization: Not on file    Attends meetings of clubs or organizations: Not on file    Relationship status: Not on file  . Intimate partner violence:    Fear of current or ex partner: Not on file    Emotionally abused: Not on file    Physically abused: Not on file    Forced sexual activity: Not on file  Other Topics Concern  . Not on file  Social History Narrative  . Not on file    Outpatient Encounter Medications as of 07/09/2017  Medication Sig Note  . magnesium oxide (MAG-OX) 400 MG tablet Take 1 tablet (400 mg total) 2 (two) times daily by mouth.   . Magnesium Oxide 400 (240 Mg) MG TABS TAKE 1 TABLET BY MOUTH TWICE DAILY   . omega-3 acid ethyl esters (LOVAZA) 1 g capsule Take 4 capsules daily   . rosuvastatin (CRESTOR) 10 MG tablet TAKE 1 TABLET BY MOUTH AT BEDTIME   . Melburn Popper  80 MG TABS  06/20/2015: Received from: External Pharmacy   No facility-administered encounter medications on file as of 07/09/2017.     No Known Allergies  Review of Systems  Constitutional: Negative for fever.  Respiratory: Negative.   Cardiovascular: Negative.   Gastrointestinal: Positive for blood in stool. Negative for abdominal pain, constipation, diarrhea, heartburn, melena, nausea and vomiting.  Genitourinary: Negative.   Endo/Heme/Allergies: Negative.   Psychiatric/Behavioral: Negative.     Objective:  BP 130/78 (BP Location: Right Arm, Patient Position: Sitting, Cuff Size: Normal)   Pulse 84   Temp 98 F (36.7 C) (Oral)   Resp 16   Wt 268 lb (121.6 kg)   BMI 35.36 kg/m   Physical Exam  Constitutional: He is oriented to person, place, and time and well-developed, well-nourished, and in no distress.  HENT:  Head: Normocephalic and atraumatic.  Eyes: Conjunctivae are normal. No scleral  icterus.  Neck: No thyromegaly present.  Cardiovascular: Normal rate and regular rhythm.  Pulmonary/Chest: Effort normal.  Abdominal: Soft.  Genitourinary:  Genitourinary Comments: Perianal exam reveal apparent internal hemorrhoids. DRE /anoscopy not performed as I think he probably needs colonoscopy.  Musculoskeletal: He exhibits no edema.  Neurological: He is alert and oriented to person, place, and time. Gait normal. GCS score is 15.  Skin: Skin is warm and dry.  Psychiatric: Mood, memory, affect and judgment normal.    Assessment and Plan :  Spontaneous Rectal Bleeding Obtain stst CBC,look at platelets. Refer urgently to GI.No evidence of upper GI issues. S/p Splenectomy HLD Gout  I have done the exam and reviewed the chart and it is accurate to the best of my knowledge. Development worker, community has been used and  any errors in dictation or transcription are unintentional. Miguel Aschoff M.D. [facility]

## 2017-07-10 ENCOUNTER — Ambulatory Visit: Payer: BLUE CROSS/BLUE SHIELD | Admitting: Gastroenterology

## 2017-07-10 ENCOUNTER — Encounter: Payer: Self-pay | Admitting: Gastroenterology

## 2017-07-10 VITALS — BP 122/82 | HR 80 | Temp 98.6°F | Ht 73.0 in | Wt 267.6 lb

## 2017-07-10 DIAGNOSIS — K760 Fatty (change of) liver, not elsewhere classified: Secondary | ICD-10-CM

## 2017-07-10 DIAGNOSIS — K921 Melena: Secondary | ICD-10-CM | POA: Diagnosis not present

## 2017-07-10 LAB — CBC WITH DIFFERENTIAL/PLATELET
BASOS ABS: 0 10*3/uL (ref 0.0–0.2)
BASOS: 0 %
EOS (ABSOLUTE): 0.1 10*3/uL (ref 0.0–0.4)
EOS: 1 %
HEMATOCRIT: 46.7 % (ref 37.5–51.0)
HEMOGLOBIN: 16.7 g/dL (ref 13.0–17.7)
IMMATURE GRANS (ABS): 0 10*3/uL (ref 0.0–0.1)
Immature Granulocytes: 0 %
LYMPHS: 15 %
Lymphocytes Absolute: 1.4 10*3/uL (ref 0.7–3.1)
MCH: 30.2 pg (ref 26.6–33.0)
MCHC: 35.8 g/dL — ABNORMAL HIGH (ref 31.5–35.7)
MCV: 84 fL (ref 79–97)
MONOCYTES: 13 %
Monocytes Absolute: 1.2 10*3/uL — ABNORMAL HIGH (ref 0.1–0.9)
NEUTROS ABS: 6.8 10*3/uL (ref 1.4–7.0)
Neutrophils: 71 %
PLATELETS: 91 10*3/uL — AB (ref 150–450)
RBC: 5.53 x10E6/uL (ref 4.14–5.80)
RDW: 14.1 % (ref 12.3–15.4)
WBC: 9.5 10*3/uL (ref 3.4–10.8)

## 2017-07-10 NOTE — Addendum Note (Signed)
Addended by: Earl Lagos on: 07/10/2017 05:09 PM   Modules accepted: Orders

## 2017-07-10 NOTE — Patient Instructions (Signed)
F/U 3 months Schedule Ultrasound by calling [phone]  High-Fiber Diet Fiber, also called dietary fiber, is a type of carbohydrate found in fruits, vegetables, whole grains, and beans. A high-fiber diet can have many health benefits. Your health care provider may recommend a high-fiber diet to help:  Prevent constipation. Fiber can make your bowel movements more regular.  Lower your cholesterol.  Relieve hemorrhoids, uncomplicated diverticulosis, or irritable bowel syndrome.  Prevent overeating as part of a weight-loss plan.  Prevent heart disease, type 2 diabetes, and certain cancers.  What is my plan? The recommended daily intake of fiber includes:  38 grams for men under age 69.  97 grams for men over age 74.  1 grams for women under age 5.  10 grams for women over age 8.  You can get the recommended daily intake of dietary fiber by eating a variety of fruits, vegetables, grains, and beans. Your health care provider may also recommend a fiber supplement if it is not possible to get enough fiber through your diet. What do I need to know about a high-fiber diet?  Fiber supplements have not been widely studied for their effectiveness, so it is better to get fiber through food sources.  Always check the fiber content on thenutrition facts label of any prepackaged food. Look for foods that contain at least 5 grams of fiber per serving.  Ask your dietitian if you have questions about specific foods that are related to your condition, especially if those foods are not listed in the following section.  Increase your daily fiber consumption gradually. Increasing your intake of dietary fiber too quickly may cause bloating, cramping, or gas.  Drink plenty of water. Water helps you to digest fiber. What foods can I eat? Grains Whole-grain breads. Multigrain cereal. Oats and oatmeal. Brown rice. Barley. Bulgur wheat. [place]. Bran muffins. Popcorn. Rye wafer  crackers. Vegetables Sweet potatoes. Spinach. Kale. Artichokes. Cabbage. Broccoli. Green peas. Carrots. Squash. Fruits Berries. Pears. Apples. Oranges. Avocados. Prunes and raisins. Dried figs. Meats and Other Protein Sources [place], kidney, pinto, and soy beans. Split peas. Lentils. Nuts and seeds. Dairy Fiber-fortified yogurt. Beverages Fiber-fortified soy milk. Fiber-fortified orange juice. Other Fiber bars. The items listed above may not be a complete list of recommended foods or beverages. Contact your dietitian for more options. What foods are not recommended? Grains White bread. Pasta made with refined flour. White rice. Vegetables Fried potatoes. Canned vegetables. Well-cooked vegetables. Fruits Fruit juice. Cooked, strained fruit. Meats and Other Protein Sources Fatty cuts of meat. Fried Sales executive or fried fish. Dairy Milk. Yogurt. Cream cheese. Sour cream. Beverages Soft drinks. Other Cakes and pastries. Butter and oils. The items listed above may not be a complete list of foods and beverages to avoid. Contact your dietitian for more information. What are some tips for including high-fiber foods in my diet?  Eat a wide variety of high-fiber foods.  Make sure that half of all grains consumed each day are whole grains.  Replace breads and cereals made from refined flour or white flour with whole-grain breads and cereals.  Replace white rice with brown rice, bulgur wheat, or millet.  Start the day with a breakfast that is high in fiber, such as a cereal that contains at least 5 grams of fiber per serving.  Use beans in place of meat in soups, salads, or pasta.  Eat high-fiber snacks, such as berries, raw vegetables, nuts, or popcorn. This information is not intended to replace advice given to you by  your health care provider. Make sure you discuss any questions you have with your health care provider. Document Released: 01/21/2005 Document Revised: 06/29/2015 Document  Reviewed: 07/06/2013 Elsevier Interactive Patient Education  [employer].

## 2017-07-10 NOTE — Progress Notes (Signed)
Ryan Simmons [address]  [place]  [place], [place] [address]  Main: [phone]  Fax: [phone]   Gastroenterology Consultation  Referring Provider:     Jerrol Banana.,* Primary Care Physician:  Jerrol Banana., MD Primary Gastroenterologist:  Dr. Vonda Simmons Reason for Consultation:     Bright blood per rectum        HPI:    Chief Complaint  Patient presents with  . Establish Care    referral from [facility] for rectal bleed    HARSHAL SIRMON is a 42 y.o. y/o male referred for consultation & management  by Dr. Rosanna Randy, Retia Passe., MD.  Patient reports bright red blood per rectum started 2 days ago.  Symptoms have not completely resolved.  Patient has had brown bowel movements today.  Hemoglobin after 1 day of symptoms was completely normal.  Denies any diarrhea, constipation, straining, abdominal pain, nausea, vomiting, loss of appetite, loss of weight, dysphagia, heartburn.  No family history of colon cancer.  No previous endoscopy or colonoscopy.  Reports history of fatty liver.  Drinks alcohol on weekends, drinks about a 12 pack on weekends.  Past Medical History:  Diagnosis Date  . Allergy   . Anemia   . Gout     Past Surgical History:  Procedure Laterality Date  . KNEE SURGERY Left   . SHOULDER SURGERY Right   . SPLENECTOMY      Prior to Admission medications   Medication Sig Start Date End Date Taking? Authorizing Provider  Magnesium Oxide 400 (240 Mg) MG TABS TAKE 1 TABLET BY MOUTH TWICE DAILY 06/08/17  Yes Jerrol Banana., MD  rosuvastatin (CRESTOR) 10 MG tablet TAKE 1 TABLET BY MOUTH AT BEDTIME 06/08/17  Yes Jerrol Banana., MD  ULORIC 80 MG TABS  05/05/15  Yes [provider]  magnesium oxide (MAG-OX) 400 MG tablet Take 1 tablet (400 mg total) 2 (two) times daily by mouth. Patient not taking: Reported on 07/10/2017 12/10/16   Jerrol Banana., MD  omega-3 acid ethyl esters (LOVAZA) 1 g capsule  Take 4 capsules daily Patient not taking: Reported on 07/10/2017 07/05/16   Jerrol Banana., MD    Family History  Problem Relation Age of Onset  . Gout Father   . Heart disease Father        atrial fib  . Healthy Mother   . Healthy Brother      Social History   Tobacco Use  . Smoking status: Never Smoker  . Smokeless tobacco: Current User    Types: Chew  Substance Use Topics  . Alcohol use: Yes    Alcohol/week: 0.0 oz    Comment: ocassionally   . Drug use: No    Allergies as of 07/10/2017  . (No Known Allergies)    Review of Systems:    All systems reviewed and negative except where noted in HPI.   Physical Exam:  BP 122/82   Pulse 80   Temp 98.6 F (37 C) (Oral)   Ht 6\' 1"  (1.854 m)   Wt 267 lb 9.6 oz (121.4 kg)   BMI 35.31 kg/m  No LMP for male patient. Psych:  Alert and cooperative. Normal mood and affect. General:   Alert,  Well-developed, well-nourished, pleasant and cooperative in NAD Head:  Normocephalic and atraumatic. Eyes:  Sclera clear, no icterus.   Conjunctiva pink. Ears:  Normal auditory acuity. Nose:  No deformity, discharge, or  lesions. Mouth:  No deformity or lesions,oropharynx pink & moist. Neck:  Supple; no masses or thyromegaly. Lungs:  Respirations even and unlabored.  Clear throughout to auscultation.   No wheezes, crackles, or rhonchi. No acute distress. Heart:  Regular rate and rhythm; no murmurs, clicks, rubs, or gallops. Abdomen:  Normal bowel sounds.  No bruits.  Soft, non-tender and non-distended without masses, hepatosplenomegaly or hernias noted.  No guarding or rebound tenderness.    Msk:  Symmetrical without gross deformities. Good, equal movement & strength bilaterally. Pulses:  Normal pulses noted. Extremities:  No clubbing or edema.  No cyanosis. Neurologic:  Alert and oriented x3;  grossly normal neurologically. Skin:  Intact without significant lesions or rashes. No jaundice. Lymph Nodes:  No significant cervical  adenopathy. Psych:  Alert and cooperative. Normal mood and affect.   Labs: CBC    Component Value Date/Time   WBC 9.5 07/09/2017 1536   WBC 6.8 01/08/2017 0843   RBC 5.53 07/09/2017 1536   RBC 5.51 01/08/2017 0843   HGB 16.7 07/09/2017 1536   HCT 46.7 07/09/2017 1536   PLT 91 (LL) 07/09/2017 1536   MCV 84 07/09/2017 1536   MCH 30.2 07/09/2017 1536   MCH 28.7 01/08/2017 0843   MCHC 35.8 (H) 07/09/2017 1536   MCHC 34.6 01/08/2017 0843   RDW 14.1 07/09/2017 1536   LYMPHSABS 1.4 07/09/2017 1536   EOSABS 0.1 07/09/2017 1536   BASOSABS 0.0 07/09/2017 1536   CMP     Component Value Date/Time   NA 138 01/08/2017 0843   NA 141 05/23/2016 1043   K 4.2 01/08/2017 0843   CL 104 01/08/2017 0843   CO2 27 01/08/2017 0843   GLUCOSE 121 (H) 01/08/2017 0843   BUN 15 01/08/2017 0843   BUN 12 05/23/2016 1043   CREATININE 0.92 01/08/2017 0843   CALCIUM 9.1 01/08/2017 0843   PROT 7.1 01/08/2017 0843   PROT 6.8 07/02/2016 0926   ALBUMIN 4.2 07/02/2016 0926   AST 30 01/08/2017 0843   ALT 47 (H) 01/08/2017 0843   ALKPHOS 59 07/02/2016 0926   BILITOT 0.6 01/08/2017 0843   BILITOT 0.5 07/02/2016 0926   GFRNONAA 103 01/08/2017 0843   GFRAA 119 01/08/2017 0843    Imaging Studies: No results found.  Assessment and Plan:   JACIEL DIEM is a 42 y.o. y/o male has been referred for bright of blood per rectum that lasted for 1 to 2 days and self resolved  Symptoms are consistent with most likely hemorrhoids However, malignancy cannot be ruled out until further evaluation Will order colonoscopy for hematochezia High-fiber diet Maintain soft stool  Patient's ALT has been chronically mildly elevated to 40s. This is likely due to fatty liver Last ultrasound was in March 2017 that showed fatty liver We will repeat CMP at this time His platelets were noted to be in the 90s on check yesterday.  Patient reports history of ITP, and that is likely the cause.  However, patient may have  developed underlying cirrhosis given his history of hepatic steatosis, and continued alcohol use We will thus repeat abdominal ultrasound at this time as well We will check INR We will also schedule for EGD along with his colonoscopy for variceal screening and look for any signs of portal hypertension  we will also refer to hematology due to history of ITP  We will check for hepatitis A and B immunity, and will hepatitis C.  And if not present, patient should get vaccinated by his  primary care provider for hepatitis A and B, especially given his history of splenectomy  I have discussed alternative options, risks & benefits,  which include, but are not limited to, bleeding, infection, perforation,respiratory complication & drug reaction.  The patient agrees with this plan & written consent will be obtained.      Dr Ryan Simmons

## 2017-07-11 MED ORDER — BISACODYL 5 MG PO TBEC: 10.0000 mg | DELAYED_RELEASE_TABLET | Freq: Once | ORAL | 0 refills | Status: AC

## 2017-07-11 MED ORDER — PEG 3350-KCL-NA BICARB-NACL 420 G PO SOLR: ORAL | 0 refills | Status: DC

## 2017-07-11 NOTE — Addendum Note (Signed)
Addended by: Earl Lagos on: 07/11/2017 03:43 PM   Modules accepted: Orders, SmartSet

## 2017-07-14 DIAGNOSIS — K921 Melena: Secondary | ICD-10-CM | POA: Diagnosis not present

## 2017-07-14 DIAGNOSIS — K760 Fatty (change of) liver, not elsewhere classified: Secondary | ICD-10-CM | POA: Diagnosis not present

## 2017-07-15 LAB — HEPATITIS B SURFACE ANTIGEN: Hepatitis B Surface Ag: NEGATIVE

## 2017-07-15 LAB — CBC WITH DIFFERENTIAL/PLATELET
BASOS: 0 %
Basophils Absolute: 0 10*3/uL (ref 0.0–0.2)
EOS (ABSOLUTE): 0.1 10*3/uL (ref 0.0–0.4)
Eos: 1 %
Hematocrit: 47 % (ref 37.5–51.0)
Hemoglobin: 16.4 g/dL (ref 13.0–17.7)
IMMATURE GRANS (ABS): 0 10*3/uL (ref 0.0–0.1)
Immature Granulocytes: 0 %
Lymphocytes Absolute: 1.2 10*3/uL (ref 0.7–3.1)
Lymphs: 10 %
MCH: 29.3 pg (ref 26.6–33.0)
MCHC: 34.9 g/dL (ref 31.5–35.7)
MCV: 84 fL (ref 79–97)
MONOS ABS: 1.4 10*3/uL — AB (ref 0.1–0.9)
Monocytes: 11 %
NEUTROS ABS: 9.3 10*3/uL — AB (ref 1.4–7.0)
Neutrophils: 78 %
PLATELETS: 122 10*3/uL — AB (ref 150–450)
RBC: 5.6 x10E6/uL (ref 4.14–5.80)
RDW: 14.3 % (ref 12.3–15.4)
WBC: 12.1 10*3/uL — ABNORMAL HIGH (ref 3.4–10.8)

## 2017-07-15 LAB — COMPREHENSIVE METABOLIC PANEL
A/G RATIO: 1.9 (ref 1.2–2.2)
ALT: 54 IU/L — AB (ref 0–44)
AST: 34 IU/L (ref 0–40)
Albumin: 4.6 g/dL (ref 3.5–5.5)
Alkaline Phosphatase: 47 IU/L (ref 39–117)
BILIRUBIN TOTAL: 0.6 mg/dL (ref 0.0–1.2)
BUN/Creatinine Ratio: 23 — ABNORMAL HIGH (ref 9–20)
BUN: 21 mg/dL (ref 6–24)
CHLORIDE: 106 mmol/L (ref 96–106)
CO2: 22 mmol/L (ref 20–29)
Calcium: 9.5 mg/dL (ref 8.7–10.2)
Creatinine, Ser: 0.92 mg/dL (ref 0.76–1.27)
GFR calc non Af Amer: 102 mL/min/{1.73_m2} (ref >59)
GFR, EST AFRICAN AMERICAN: 118 mL/min/{1.73_m2} (ref >59)
Globulin, Total: 2.4 g/dL (ref 1.5–4.5)
Glucose: 114 mg/dL — ABNORMAL HIGH (ref 65–99)
POTASSIUM: 4.6 mmol/L (ref 3.5–5.2)
Sodium: 146 mmol/L — ABNORMAL HIGH (ref 134–144)
TOTAL PROTEIN: 7 g/dL (ref 6.0–8.5)

## 2017-07-15 LAB — HEPATITIS A ANTIBODY, IGM: HEP A IGM: NEGATIVE

## 2017-07-15 LAB — HEPATITIS B CORE ANTIBODY, IGM: HEP B C IGM: NEGATIVE

## 2017-07-15 LAB — HEPATITIS B SURFACE ANTIBODY,QUALITATIVE: HEP B SURFACE AB, QUAL: NONREACTIVE

## 2017-07-15 LAB — HEPATITIS B CORE ANTIBODY, TOTAL: Hep B Core Total Ab: NEGATIVE

## 2017-07-15 LAB — PROTIME-INR
INR: 1 (ref 0.8–1.2)
Prothrombin Time: 10.1 s (ref 9.1–12.0)

## 2017-07-15 LAB — HEPATIC FUNCTION PANEL: BILIRUBIN, DIRECT: 0.19 mg/dL (ref 0.00–0.40)

## 2017-07-15 LAB — HEPATITIS A ANTIBODY, TOTAL: Hep A Total Ab: NEGATIVE

## 2017-07-15 LAB — HEPATITIS C ANTIBODY: Hep C Virus Ab: <0.1 s/co ratio (ref 0.0–0.9)

## 2017-07-18 ENCOUNTER — Inpatient Hospital Stay: Payer: BLUE CROSS/BLUE SHIELD | Attending: Hematology and Oncology | Admitting: Hematology and Oncology

## 2017-07-18 ENCOUNTER — Encounter: Payer: Self-pay | Admitting: Hematology and Oncology

## 2017-07-18 ENCOUNTER — Inpatient Hospital Stay: Payer: BLUE CROSS/BLUE SHIELD

## 2017-07-18 VITALS — BP 134/76 | HR 96 | Temp 97.4°F | Resp 16 | Wt 267.0 lb

## 2017-07-18 DIAGNOSIS — Z9081 Acquired absence of spleen: Secondary | ICD-10-CM

## 2017-07-18 DIAGNOSIS — M549 Dorsalgia, unspecified: Secondary | ICD-10-CM

## 2017-07-18 DIAGNOSIS — G8929 Other chronic pain: Secondary | ICD-10-CM | POA: Diagnosis not present

## 2017-07-18 DIAGNOSIS — D693 Immune thrombocytopenic purpura: Secondary | ICD-10-CM

## 2017-07-18 DIAGNOSIS — M25561 Pain in right knee: Secondary | ICD-10-CM | POA: Insufficient documentation

## 2017-07-18 DIAGNOSIS — K921 Melena: Secondary | ICD-10-CM

## 2017-07-18 DIAGNOSIS — M25562 Pain in left knee: Secondary | ICD-10-CM | POA: Insufficient documentation

## 2017-07-18 DIAGNOSIS — E7849 Other hyperlipidemia: Secondary | ICD-10-CM

## 2017-07-18 LAB — PLATELET BY CITRATE: Platelet CT in Citrate: 135

## 2017-07-18 LAB — CBC WITH DIFFERENTIAL/PLATELET
Basophils Absolute: 0.1 10*3/uL (ref 0–0.1)
Basophils Relative: 1 %
Eosinophils Absolute: 0.1 10*3/uL (ref 0–0.7)
Eosinophils Relative: 1 %
HEMATOCRIT: 47 % (ref 40.0–52.0)
HEMOGLOBIN: 16.4 g/dL (ref 13.0–18.0)
LYMPHS ABS: 1.1 10*3/uL (ref 1.0–3.6)
LYMPHS PCT: 12 %
MCH: 29.7 pg (ref 26.0–34.0)
MCHC: 34.9 g/dL (ref 32.0–36.0)
MCV: 85.1 fL (ref 80.0–100.0)
MONOS PCT: 10 %
Monocytes Absolute: 0.9 10*3/uL (ref 0.2–1.0)
NEUTROS ABS: 7 10*3/uL — AB (ref 1.4–6.5)
NEUTROS PCT: 76 %
Platelets: 142 10*3/uL — ABNORMAL LOW (ref 150–440)
RBC: 5.52 MIL/uL (ref 4.40–5.90)
RDW: 14.6 % — ABNORMAL HIGH (ref 11.5–14.5)
WBC: 9.2 10*3/uL (ref 3.8–10.6)

## 2017-07-18 NOTE — Progress Notes (Signed)
[facility] day:  07/18/2017  Chief Complaint: Ryan Simmons is a 42 y.o. male with thrombocytopenia who is referred in consultation by Dr. Bonna Gains for assessment and management.  HPI:  The patient notes a history of immune mediated thrombocytopenic purpura (ITP) that was diagnosed at 94 months of age. Platelets ranged in the 30,000 - 50,000 range until splenectomy was performed when he was around 42 years old. Post splenectomy, platelet counts have been in the 90,000 - 160,000 range.  He receives his immunizations regularly.  He was referred to Dr. Bonna Gains on 07/10/2017 for hematochezia.  Symptoms were felt secondary to hemorrhoids.  He also had a history of mildly elevated LFTs felt due to fatty liver.  Cirrhosis was considered given possible hepatic steatosis and his alcohol intake.  He drinks a 12 pack on the weekends.  Liver ultrasound ordered.  Colonoscopy and EGD are planned.  Labs on 07/14/2017 revealed a hematocrit of 47.0, hemoglobin 16.4, MCV 84, platelets 122,000, white count 12,100 with an ANC of 9300.  Differential was unremarkable.  Normal studies included: PT/INR, hepatitis A antibody total, hepatitis A IgM, hepatitis B core IgM, hepatitis B surface antibody, hepatitis B surface antigen, hepatitis B core antibody total, hepatitis C antibody.  ALT was 54.  Platelet count dating back to 08/26/2011 revealed platelets 91,000 - 161,000 without trend.  Absolute monocyte count 1200- 1400 in 07/2017.  Patient has suspected internal hemorrhoids. He is scheduled for an EGD and colonoscopy next week. Bleeding that precipitated visit to GI office only lasted "about a day".   He has a younger brother, Maylon Cos, who also had ITP and received IVIG.  He is hearing impaired.  His platelet count normalized after splenectomy.   Past Medical History:  Diagnosis Date  . Allergy   . Anemia   . Gout     Past Surgical History:  Procedure Laterality Date   . KNEE SURGERY Left   . SHOULDER SURGERY Right   . SPLENECTOMY      Family History  Problem Relation Age of Onset  . Gout Father   . Heart disease Father        atrial fib  . Healthy Mother   . Healthy Brother     Social History:  reports that he has never smoked. His smokeless tobacco use includes chew. He reports that he drinks alcohol. He reports that he does not use drugs.  Patient uses a box of smokeless tobacco a day x 20 years. He is using Nicorette gum. He drinks alcohol "socially" on the weekends. Patient denies known exposures to radiation on toxins. He played football in college.  The patient is accompanied by his mother today.  Allergies: No Known Allergies  Current Medications: Current Outpatient Medications  Medication Sig Dispense Refill  . magnesium oxide (MAG-OX) 400 MG tablet Take 1 tablet (400 mg total) 2 (two) times daily by mouth. 60 tablet 2  . omega-3 acid ethyl esters (LOVAZA) 1 g capsule Take 4 capsules daily 120 capsule 12  . rosuvastatin (CRESTOR) 10 MG tablet TAKE 1 TABLET BY MOUTH AT BEDTIME 90 tablet 3  . ULORIC 80 MG TABS   0  . polyethylene glycol-electrolytes (NULYTELY/GOLYTELY) 420 g solution Split into 2 doses (half at 5pm, half 5hr prior to procedure. (Patient not taking: Reported on 07/18/2017) 4000 mL 0   No current facility-administered medications for this visit.     Review of Systems  Constitutional: Positive for weight loss (  intentional - 20 pound weight loss in last 4 months. ). Negative for diaphoresis, fever and malaise/fatigue.  HENT: Negative.  Negative for congestion, ear pain, nosebleeds, sinus pain and sore throat.   Eyes: Negative.  Negative for blurred vision, double vision, pain, discharge and redness.  Respiratory: Negative.  Negative for cough, hemoptysis, sputum production and shortness of breath.   Cardiovascular: Negative.  Negative for chest pain, palpitations, orthopnea, leg swelling and PND.  Gastrointestinal: Positive  for blood in stool (internal hemorrhoids). Negative for abdominal pain, constipation, diarrhea, melena, nausea and vomiting.  Genitourinary: Negative.  Negative for dysuria, frequency, hematuria and urgency.       History of gout  Musculoskeletal: Positive for back pain (chronic) and joint pain (chronic - knees and hips). Negative for falls and myalgias.  Skin: Negative.  Negative for itching and rash.  Neurological: Negative.  Negative for dizziness, tremors, weakness and headaches.  Endo/Heme/Allergies: Negative.  Does not bruise/bleed easily.  Psychiatric/Behavioral: Negative for depression, memory loss and suicidal ideas. The patient is not nervous/anxious and does not have insomnia.   All other systems reviewed and are negative.  Performance status (ECOG): 0 - Asymptomatic  Physical Exam: Blood pressure 134/76, pulse 96, temperature (!) 97.4 F (36.3 C), temperature source Tympanic, resp. rate 16, weight 267 lb (121.1 kg). GENERAL:  Well developed, well nourished, gentleman sitting comfortably in the exam room in no acute distress. MENTAL STATUS:  Alert and oriented to person, place and time. HEAD:  Short dark brown hair. [place].  Normocephalic, atraumatic, face symmetric, no Cushingoid features. EYES:  Brown eyes.  Pupils equal round and reactive to light and accomodation.  No conjunctivitis or scleral icterus. ENT:  Oropharynx clear without lesion.  Tongue normal. Mucous membranes moist.  RESPIRATORY:  Clear to auscultation without rales, wheezes or rhonchi. CARDIOVASCULAR:  Regular rate and rhythm without murmur, rub or gallop. ABDOMEN:  Soft, non-tender, with active bowel sounds, and no hepatomegaly.  s/p splenectomy.  No masses. SKIN:  No rashes, ulcers or lesions. EXTREMITIES: No edema, no skin discoloration or tenderness.  No palpable cords. LYMPH NODES: No palpable cervical, supraclavicular, axillary or inguinal adenopathy  NEUROLOGICAL: Unremarkable. PSYCH:   Appropriate.   No visits with results within 3 Day(s) from this visit.  Latest known visit with results is:  Office Visit on 07/10/2017  Component Date Value Ref Range Status  . Bilirubin, Direct 07/14/2017 0.19  0.00 - 0.40 mg/dL Final  . WBC 07/14/2017 12.1* 3.4 - 10.8 x10E3/uL Final  . RBC 07/14/2017 5.60  4.14 - 5.80 x10E6/uL Final  . Hemoglobin 07/14/2017 16.4  13.0 - 17.7 g/dL Final  . Hematocrit 07/14/2017 47.0  37.5 - 51.0 % Final  . MCV 07/14/2017 84  79 - 97 fL Final  . MCH 07/14/2017 29.3  26.6 - 33.0 pg Final  . MCHC 07/14/2017 34.9  31.5 - 35.7 g/dL Final  . RDW 07/14/2017 14.3  12.3 - 15.4 % Final  . Platelets 07/14/2017 122* 150 - 450 x10E3/uL Final  . Neutrophils 07/14/2017 78  Not Estab. % Final  . Lymphs 07/14/2017 10  Not Estab. % Final  . Monocytes 07/14/2017 11  Not Estab. % Final  . Eos 07/14/2017 1  Not Estab. % Final  . Basos 07/14/2017 0  Not Estab. % Final  . Neutrophils Absolute 07/14/2017 9.3* 1.4 - 7.0 x10E3/uL Final  . Lymphocytes Absolute 07/14/2017 1.2  0.7 - 3.1 x10E3/uL Final  . Monocytes Absolute 07/14/2017 1.4* 0.1 - 0.9 x10E3/uL  Final  . EOS (ABSOLUTE) 07/14/2017 0.1  0.0 - 0.4 x10E3/uL Final  . Basophils Absolute 07/14/2017 0.0  0.0 - 0.2 x10E3/uL Final  . Immature Granulocytes 07/14/2017 0  Not Estab. % Final  . Immature Grans (Abs) 07/14/2017 0.0  0.0 - 0.1 x10E3/uL Final  . Glucose 07/14/2017 114* 65 - 99 mg/dL Final  . BUN 07/14/2017 21  6 - 24 mg/dL Final  . Creatinine, Ser 07/14/2017 0.92  0.76 - 1.27 mg/dL Final  . GFR calc non Af Amer 07/14/2017 102  >59 mL/min/1.73 Final  . GFR calc Af Amer 07/14/2017 118  >59 mL/min/1.73 Final  . BUN/Creatinine Ratio 07/14/2017 23* 9 - 20 Final  . Sodium 07/14/2017 146* 134 - 144 mmol/L Final  . Potassium 07/14/2017 4.6  3.5 - 5.2 mmol/L Final  . Chloride 07/14/2017 106  96 - 106 mmol/L Final  . CO2 07/14/2017 22  20 - 29 mmol/L Final  . Calcium 07/14/2017 9.5  8.7 - 10.2 mg/dL Final  . Total  Protein 07/14/2017 7.0  6.0 - 8.5 g/dL Final  . Albumin 07/14/2017 4.6  3.5 - 5.5 g/dL Final  . Globulin, Total 07/14/2017 2.4  1.5 - 4.5 g/dL Final  . Albumin/Globulin Ratio 07/14/2017 1.9  1.2 - 2.2 Final  . Bilirubin Total 07/14/2017 0.6  0.0 - 1.2 mg/dL Final  . Alkaline Phosphatase 07/14/2017 47  39 - 117 IU/L Final  . AST 07/14/2017 34  0 - 40 IU/L Final  . ALT 07/14/2017 54* 0 - 44 IU/L Final  . INR 07/14/2017 1.0  0.8 - 1.2 Final   Comment: Reference interval is for non-anticoagulated patients. Suggested INR therapeutic range for Vitamin K antagonist therapy:    Standard Dose (moderate intensity                   therapeutic range):       2.0 - 3.0    Higher intensity therapeutic range       2.5 - 3.5   . Prothrombin Time 07/14/2017 10.1  9.1 - 12.0 sec Final  . Hep B Surface Ab, Qual 07/14/2017 Non Reactive   Final   Comment:               Non Reactive: Inconsistent with immunity,                             less than 10 mIU/mL               Reactive:     Consistent with immunity,                             greater than 9.9 mIU/mL   . Hep C Virus Ab 07/14/2017 <0.1  0.0 - 0.9 s/co ratio Final   Comment:                                   Negative:     < 0.8                              Indeterminate: 0.8 - 0.9  Positive:     > 0.9  The CDC recommends that a positive HCV antibody result  be followed up with a HCV Nucleic Acid Amplification  test (680321).   . Hepatitis B Surface Ag 07/14/2017 Negative  Negative Final  . Hep B Core Total Ab 07/14/2017 Negative  Negative Final  . Hep A Total Ab 07/14/2017 Negative  Negative Final  . Hep A IgM 07/14/2017 Negative  Negative Final  . Hep B C IgM 07/14/2017 Negative  Negative Final    Assessment:  LEONCE BALE is a 42 y.o. male with immune mediated thrombocytopenic purpura (ITP).  He was diagnosed at 38 months of age. Platelets ranged in the 30,000 - 50,000 range until splenectomy (age 34).   Post splenectomy, platelet counts have been in the 90,000 - 160,000 range.   Labs on 07/14/2017 revealed a hematocrit of 47.0, hemoglobin 16.4, MCV 84, platelets 122,000, white count 12,100 with an ANC of 9300.  Differential was unremarkable.  Normal studies included: PT/INR, hepatitis A , B, and C serologies.  ALT was 54.  He denies any excess bruising or bleeding.  Exam is unremarkable.  Plan: 1. Discuss thrombocytopenia. Patient has a had ITP diagnosis since the age of 33 months. He has had a splenectomy. Discuss indications for treatment. Patient will require treatment for platelet count < 30,000. Discussed corticosteroids, IVIG, Rituxan, romiplostim, and Promacta as potential treatments in the future should platelet count become low.  No indication for treatment now. 2. Labs today:  CBC with diff, platelet count blue top tube, ANA with reflex, H. pylori serologies 3. Follow-up pending abdominal ultrasound. 4. RTC PRN - will call patient with results.    Honor Loh, NP  07/18/2017, 2:34 PM   I saw and evaluated the patient, participating in the key portions of the service and reviewing pertinent diagnostic studies and records.  I reviewed the nurse practitioner's note and agree with the findings and the plan.  The assessment and plan were discussed with the patient.  A few questions were asked by the patient and answered.   Nolon Stalls, MD 07/18/2017, 2:34 PM

## 2017-07-21 ENCOUNTER — Ambulatory Visit
Admission: RE | Admit: 2017-07-21 | Discharge: 2017-07-21 | Disposition: A | Discharge status: Home / Self Care | Payer: BLUE CROSS/BLUE SHIELD | Source: Ambulatory Visit | Attending: Gastroenterology | Admitting: Gastroenterology

## 2017-07-21 DIAGNOSIS — K760 Fatty (change of) liver, not elsewhere classified: Secondary | ICD-10-CM | POA: Diagnosis not present

## 2017-07-21 LAB — ANA W/REFLEX: Anti Nuclear Antibody(ANA): NEGATIVE

## 2017-07-22 LAB — H. PYLORI ANTIBODY, IGG

## 2017-07-24 ENCOUNTER — Encounter: Payer: Self-pay | Admitting: *Deleted

## 2017-07-25 ENCOUNTER — Encounter: Payer: Self-pay | Admitting: *Deleted

## 2017-07-25 ENCOUNTER — Other Ambulatory Visit: Payer: Self-pay

## 2017-07-25 ENCOUNTER — Ambulatory Visit
Admission: RE | Admit: 2017-07-25 | Discharge: 2017-07-25 | Disposition: A | Discharge status: Home / Self Care | Payer: BLUE CROSS/BLUE SHIELD | Source: Ambulatory Visit | Attending: Gastroenterology | Admitting: Gastroenterology

## 2017-07-25 ENCOUNTER — Ambulatory Visit: Payer: BLUE CROSS/BLUE SHIELD | Admitting: Anesthesiology

## 2017-07-25 ENCOUNTER — Encounter
Admission: RE | Disposition: A | Discharge status: Home / Self Care | Payer: Self-pay | Source: Ambulatory Visit | Attending: Gastroenterology

## 2017-07-25 DIAGNOSIS — Z8249 Family history of ischemic heart disease and other diseases of the circulatory system: Secondary | ICD-10-CM | POA: Insufficient documentation

## 2017-07-25 DIAGNOSIS — D125 Benign neoplasm of sigmoid colon: Secondary | ICD-10-CM | POA: Diagnosis not present

## 2017-07-25 DIAGNOSIS — K635 Polyp of colon: Secondary | ICD-10-CM | POA: Diagnosis not present

## 2017-07-25 DIAGNOSIS — Z9081 Acquired absence of spleen: Secondary | ICD-10-CM | POA: Insufficient documentation

## 2017-07-25 DIAGNOSIS — D127 Benign neoplasm of rectosigmoid junction: Secondary | ICD-10-CM | POA: Diagnosis not present

## 2017-07-25 DIAGNOSIS — K648 Other hemorrhoids: Secondary | ICD-10-CM | POA: Diagnosis not present

## 2017-07-25 DIAGNOSIS — K7460 Unspecified cirrhosis of liver: Secondary | ICD-10-CM

## 2017-07-25 DIAGNOSIS — K6389 Other specified diseases of intestine: Secondary | ICD-10-CM

## 2017-07-25 DIAGNOSIS — K259 Gastric ulcer, unspecified as acute or chronic, without hemorrhage or perforation: Secondary | ICD-10-CM | POA: Diagnosis not present

## 2017-07-25 DIAGNOSIS — Q402 Other specified congenital malformations of stomach: Secondary | ICD-10-CM | POA: Diagnosis not present

## 2017-07-25 DIAGNOSIS — D649 Anemia, unspecified: Secondary | ICD-10-CM | POA: Diagnosis not present

## 2017-07-25 DIAGNOSIS — E785 Hyperlipidemia, unspecified: Secondary | ICD-10-CM | POA: Diagnosis not present

## 2017-07-25 DIAGNOSIS — K3189 Other diseases of stomach and duodenum: Secondary | ICD-10-CM | POA: Diagnosis not present

## 2017-07-25 DIAGNOSIS — K2950 Unspecified chronic gastritis without bleeding: Secondary | ICD-10-CM | POA: Insufficient documentation

## 2017-07-25 DIAGNOSIS — Z8349 Family history of other endocrine, nutritional and metabolic diseases: Secondary | ICD-10-CM | POA: Insufficient documentation

## 2017-07-25 DIAGNOSIS — Z8619 Personal history of other infectious and parasitic diseases: Secondary | ICD-10-CM | POA: Insufficient documentation

## 2017-07-25 DIAGNOSIS — K5790 Diverticulosis of intestine, part unspecified, without perforation or abscess without bleeding: Secondary | ICD-10-CM | POA: Insufficient documentation

## 2017-07-25 DIAGNOSIS — K745 Biliary cirrhosis, unspecified: Secondary | ICD-10-CM | POA: Diagnosis not present

## 2017-07-25 DIAGNOSIS — K921 Melena: Secondary | ICD-10-CM

## 2017-07-25 DIAGNOSIS — K5730 Diverticulosis of large intestine without perforation or abscess without bleeding: Secondary | ICD-10-CM | POA: Diagnosis not present

## 2017-07-25 DIAGNOSIS — K649 Unspecified hemorrhoids: Secondary | ICD-10-CM | POA: Diagnosis not present

## 2017-07-25 DIAGNOSIS — M109 Gout, unspecified: Secondary | ICD-10-CM | POA: Diagnosis not present

## 2017-07-25 DIAGNOSIS — Z79899 Other long term (current) drug therapy: Secondary | ICD-10-CM | POA: Diagnosis not present

## 2017-07-25 DIAGNOSIS — K929 Disease of digestive system, unspecified: Secondary | ICD-10-CM | POA: Diagnosis not present

## 2017-07-25 HISTORY — PX: COLONOSCOPY WITH PROPOFOL: SHX5780

## 2017-07-25 HISTORY — DX: Hyperlipidemia, unspecified: E78.5

## 2017-07-25 HISTORY — DX: Spotted fever due to Rickettsia rickettsii: A77.0

## 2017-07-25 HISTORY — PX: ESOPHAGOGASTRODUODENOSCOPY (EGD) WITH PROPOFOL: SHX5813

## 2017-07-25 SURGERY — COLONOSCOPY WITH PROPOFOL
Anesthesia: General

## 2017-07-25 MED ORDER — RANITIDINE HCL 75 MG PO TABS: 75.0000 mg | ORAL_TABLET | Freq: Two times a day (BID) | ORAL | 0 refills | Status: DC

## 2017-07-25 MED ORDER — PROPOFOL 500 MG/50ML IV EMUL
INTRAVENOUS | Status: DC | PRN
  Administered 2017-07-25: 150 ug/kg/min via INTRAVENOUS

## 2017-07-25 MED ORDER — PROPOFOL 500 MG/50ML IV EMUL
INTRAVENOUS | Status: AC
  Filled 2017-07-25: qty 50

## 2017-07-25 MED ORDER — GLYCOPYRROLATE 0.2 MG/ML IJ SOLN
INTRAMUSCULAR | Status: DC | PRN
  Administered 2017-07-25: 0.2 mg via INTRAVENOUS

## 2017-07-25 MED ORDER — SODIUM CHLORIDE 0.9 % IV SOLN
INTRAVENOUS | Status: DC
  Administered 2017-07-25: 08:00:00 via INTRAVENOUS

## 2017-07-25 MED ORDER — PROPOFOL 10 MG/ML IV BOLUS
INTRAVENOUS | Status: AC
  Filled 2017-07-25: qty 20

## 2017-07-25 MED ORDER — LIDOCAINE HCL (CARDIAC) PF 100 MG/5ML IV SOSY
PREFILLED_SYRINGE | INTRAVENOUS | Status: DC | PRN
  Administered 2017-07-25: 100 mg via INTRAVENOUS

## 2017-07-25 MED ORDER — PROPOFOL 10 MG/ML IV BOLUS
INTRAVENOUS | Status: DC | PRN
  Administered 2017-07-25: 50 mg via INTRAVENOUS
  Administered 2017-07-25: 40 mg via INTRAVENOUS
  Administered 2017-07-25: 120 mg via INTRAVENOUS
  Administered 2017-07-25: 80 mg via INTRAVENOUS

## 2017-07-25 NOTE — Op Note (Signed)
[facility] Gastroenterology Patient Name: Ryan Simmons Procedure Date: 07/25/2017 7:22 AM MRN: [id number] Account #: [id number] Date of Birth: August 03, 1975 Admit Type: Outpatient Age: 42 Room: [facility] ENDO ROOM 2 Gender: Male Note Status: Finalized Procedure:            Colonoscopy Indications:          Hematochezia Providers:            Dailen Mcclish B. Bonna Gains MD, MD Medicines:            Monitored Anesthesia Care Complications:        No immediate complications. Procedure:            Pre-Anesthesia Assessment:                       - ASA Grade Assessment: II - A patient with mild                        systemic disease.                       - Prior to the procedure, a History and Physical was                        performed, and patient medications, allergies and                        sensitivities were reviewed. The patient's tolerance of                        previous anesthesia was reviewed.                       - The risks and benefits of the procedure and the                        sedation options and risks were discussed with the                        patient. All questions were answered and informed                        consent was obtained.                       - Patient identification and proposed procedure were                        verified prior to the procedure by the physician, the                        nurse, the anesthesiologist, the anesthetist and the                        technician. The procedure was verified in the procedure                        room.                       After obtaining informed consent, the colonoscope was  passed under direct vision. Throughout the procedure,                        the patient's blood pressure, pulse, and oxygen                        saturations were monitored continuously. The                        Colonoscope was introduced through the anus and   advanced to the the cecum, identified by appendiceal                        orifice and ileocecal valve. The colonoscopy was                        performed with ease. The patient tolerated the                        procedure well. The quality of the bowel preparation                        was fair except the ascending colon was poor and the                        cecum was poor. Findings:      The perianal and digital rectal examinations were normal.      A localized area of mildly thickened folds of the mucosa was found at       the ileocecal valve. Biopsies were taken with a cold forceps for       histology.      A patchy area of mildly white patchy lesions (possible melanosis coli)       mucosa was found in the ascending colon. Biopsies were taken with a cold       forceps for histology.      Two sessile polyps were found in the sigmoid colon. The polyps were 2 to       3 mm in size. These polyps were removed with a cold biopsy forceps.       Resection and retrieval were complete.      Multiple diverticula were found in the sigmoid colon.      The exam was otherwise without abnormality.      The rectum, sigmoid colon, descending colon, transverse colon, ascending       colon and cecum appeared normal.      The retroflexed view of the distal rectum and anal verge was normal and       showed no anal or rectal abnormalities.      Internal hemorrhoids were found during retroflexion. Impression:           - Thickened folds of the mucosa at the ileocecal valve.                        Biopsied.                       - White patchy lesions (possible melanosis coli) mucosa                        in the ascending colon. Biopsied. One of these  lesions                        was 10mm in size and appeared slightly raised, but                        flattened on insufflation, this was removed compeletely                        via biopsy forceps and placed in the same Jar.                        - Two 2 to 3 mm polyps in the sigmoid colon, removed                        with a cold biopsy forceps. Resected and retrieved.                       - Diverticulosis in the sigmoid colon.                       - The examination was otherwise normal.                       - The rectum, sigmoid colon, descending colon,                        transverse colon, ascending colon and cecum are normal.                       - The distal rectum and anal verge are normal on                        retroflexion view.                       - Internal hemorrhoids. This is the source of patient's                        hematochezia. Recommendation:       - Discharge patient to home (with escort).                       - High fiber diet. Use Miralax daily to maintain soft                        stool                       - Advance diet as tolerated.                       - Continue present medications.                       - Await pathology results.                       - Repeat colonoscopy date to be determined after                        pending pathology results are reviewed for surveillance.                       -  The findings and recommendations were discussed with                        the patient.                       - The findings and recommendations were discussed with                        the patient's family.                       - Return to primary care physician as previously                        scheduled.                       - Due to the fair and poor prep, this was not an                        adequate exam for polyp screening. If the sigmoid colon                        polyps show adenoma, patient will need a screening                        colonoscopy in 6-12 months with a 2 day prep. If they                        are hyperplastic, screening colonoscopy as per American                        Cancer Society guidelines should be at 42 years of age                         with a 2 day prep. Procedure Code(s):    --- Professional ---                       [id number], Colonoscopy, flexible; with biopsy, single or                        multiple Diagnosis Code(s):    --- Professional ---                       K63.89, Other specified diseases of intestine                       D12.5, Benign neoplasm of sigmoid colon                       K64.8, Other hemorrhoids                       K92.1, Melena (includes Hematochezia)                       K57.30, Diverticulosis of large intestine without                        perforation or abscess without bleeding CPT copyright 2017  American Medical Association. All rights reserved. The codes documented in this report are preliminary and upon coder review may  be revised to meet current compliance requirements.  Vonda Antigua, MD Margretta Sidle B. Bonna Gains MD, MD 07/25/2017 9:11:08 AM This report has been signed electronically. Number of Addenda: 0 Note Initiated On: 07/25/2017 7:22 AM Scope Withdrawal Time: 0 hours 19 minutes 30 seconds  Total Procedure Duration: 0 hours 39 minutes 7 seconds  Estimated Blood Loss: Estimated blood loss: none.      [facility]

## 2017-07-25 NOTE — Anesthesia Preprocedure Evaluation (Addendum)
Anesthesia Evaluation  Patient identified by MRN, date of birth, ID band Patient awake    Reviewed: Allergy & Precautions, H&P , NPO status , Patient's Chart, lab work & pertinent test results, reviewed documented beta blocker date and time   History of Anesthesia Complications Negative for: history of anesthetic complications  Airway Mallampati: II  TM Distance: >3 FB Neck ROM: full    Dental  (+) Dental Advidsory Given   Pulmonary neg pulmonary ROS,           Cardiovascular Exercise Tolerance: Good negative cardio ROS       Neuro/Psych negative neurological ROS  negative psych ROS   GI/Hepatic negative GI ROS, Neg liver ROS,   Endo/Other  negative endocrine ROS  Renal/GU negative Renal ROS  negative genitourinary   Musculoskeletal   Abdominal   Peds  Hematology negative hematology ROS (+)   Anesthesia Other Findings Past Medical History: No date: Allergy No date: Anemia No date: Gout No date: Gout No date: Hyperlipemia No date: A [place] spotted fever  Obesity  Reproductive/Obstetrics negative OB ROS                             Anesthesia Physical Anesthesia Plan  ASA: II  Anesthesia Plan: General   Post-op Pain Management:    Induction: Intravenous  PONV Risk Score and Plan: 2 and Propofol infusion  Airway Management Planned: Nasal Cannula  Additional Equipment:   Intra-op Plan:   Post-operative Plan:   Informed Consent: I have reviewed the patients History and Physical, chart, labs and discussed the procedure including the risks, benefits and alternatives for the proposed anesthesia with the patient or authorized representative who has indicated his/her understanding and acceptance.   Dental Advisory Given  Plan Discussed with: Anesthesiologist, CRNA and Surgeon  Anesthesia Plan Comments:         Anesthesia Quick Evaluation

## 2017-07-25 NOTE — Anesthesia Post-op Follow-up Note (Signed)
Anesthesia QCDR form completed.        

## 2017-07-25 NOTE — H&P (Signed)
Vonda Antigua, MD [address], [place], [place], [place], [address] [address], [place], [place], [place], [address] Phone: [phone]  Fax: [phone]  Primary Care Physician:  Jerrol Banana., MD   Pre-Procedure History & Physical: HPI:  Ryan Simmons is a 42 y.o. male is here for a colonoscopy and EGD.   Past Medical History:  Diagnosis Date  . Allergy   . Anemia   . Gout   . Gout   . Hyperlipemia   . [place] spotted fever     Past Surgical History:  Procedure Laterality Date  . KNEE SURGERY Left   . SHOULDER SURGERY Right   . SPLENECTOMY      Prior to Admission medications   Medication Sig Start Date End Date Taking? Authorizing Provider  magnesium oxide (MAG-OX) 400 MG tablet Take 1 tablet (400 mg total) 2 (two) times daily by mouth. 12/10/16  Yes Jerrol Banana., MD  omega-3 acid ethyl esters (LOVAZA) 1 g capsule Take 4 capsules daily 07/05/16  Yes Jerrol Banana., MD  rosuvastatin (CRESTOR) 10 MG tablet TAKE 1 TABLET BY MOUTH AT BEDTIME 06/08/17  Yes Jerrol Banana., MD  ULORIC 80 MG TABS  05/05/15  Yes [provider]  polyethylene glycol-electrolytes (NULYTELY/GOLYTELY) 420 g solution Split into 2 doses (half at 5pm, half 5hr prior to procedure. Patient not taking: Reported on 07/18/2017 07/11/17   Virgel Manifold, MD    Allergies as of 07/14/2017  . (No Known Allergies)    Family History  Problem Relation Age of Onset  . Gout Father   . Heart disease Father        atrial fib  . Healthy Mother   . Healthy Brother     Social History   Socioeconomic History  . Marital status: Single    Spouse name: Not on file  . Number of children: 0  . Years of education: bachelors  . Highest education level: Not on file  Occupational History    Employer: [employer]  Social Needs  . Financial resource strain: Not on file  . Food insecurity:    Worry: Not on file    Inability: Not on file  .  Transportation needs:    Medical: Not on file    Non-medical: Not on file  Tobacco Use  . Smoking status: Never Smoker  . Smokeless tobacco: Current User    Types: Chew  Substance and Sexual Activity  . Alcohol use: Yes    Alcohol/week: 0.0 oz    Comment: Social   . Drug use: No  . Sexual activity: Yes  Lifestyle  . Physical activity:    Days per week: Not on file    Minutes per session: Not on file  . Stress: Not on file  Relationships  . Social connections:    Talks on phone: Not on file    Gets together: Not on file    Attends religious service: Not on file    Active member of club or organization: Not on file    Attends meetings of clubs or organizations: Not on file    Relationship status: Not on file  . Intimate partner violence:    Fear of current or ex partner: Not on file    Emotionally abused: Not on file    Physically abused: Not on file    Forced sexual activity: Not on file  Other Topics Concern  . Not on file  Social History Narrative  . Not on file    Review of Systems: See HPI, otherwise negative ROS  Physical Exam: BP 131/81   Pulse 70   Temp (!) 96 F (35.6 C) (Tympanic)   Resp 16   Ht 6\' 1"  (1.854 m)   Wt 260 lb (117.9 kg)   BMI 34.30 kg/m  General:   Alert,  pleasant and cooperative in NAD Head:  Normocephalic and atraumatic. Neck:  Supple; no masses or thyromegaly. Lungs:  Clear throughout to auscultation, normal respiratory effort.    Heart:  +S1, +S2, Regular rate and rhythm, No edema. Abdomen:  Soft, nontender and nondistended. Normal bowel sounds, without guarding, and without rebound.   Neurologic:  Alert and  oriented x4;  grossly normal neurologically.  Impression/Plan: Ryan Simmons is here for a colonoscopy to be performed for hematochezia EGD for Variceal Screening  Risks, benefits, limitations, and alternatives regarding the procedures have been reviewed with the patient.  Questions have been answered.  All parties  agreeable.   Virgel Manifold, MD  07/25/2017, 8:00 AM

## 2017-07-25 NOTE — Anesthesia Postprocedure Evaluation (Signed)
Anesthesia Post Note  Patient: VENKAT ANKNEY  Procedure(s) Performed: COLONOSCOPY WITH PROPOFOL (N/A ) ESOPHAGOGASTRODUODENOSCOPY (EGD) WITH PROPOFOL (N/A )  Patient location during evaluation: Endoscopy Anesthesia Type: General Level of consciousness: awake and alert Pain management: pain level controlled Vital Signs Assessment: post-procedure vital signs reviewed and stable Respiratory status: spontaneous breathing, nonlabored ventilation, respiratory function stable and patient connected to nasal cannula oxygen Cardiovascular status: blood pressure returned to baseline and stable Postop Assessment: no apparent nausea or vomiting Anesthetic complications: no     Last Vitals:  Vitals:   07/25/17 0924 07/25/17 0934  BP: 118/80 117/75  Pulse: 73 86  Resp: 17 15  Temp:    SpO2: 97% 95%    Last Pain:  Vitals:   07/25/17 0934  TempSrc:   PainSc: 0-No pain                 Martha Clan

## 2017-07-25 NOTE — Op Note (Signed)
[facility] Gastroenterology Patient Name: Ryan Simmons Procedure Date: 07/25/2017 7:27 AM MRN: [id number] Account #: [id number] Date of Birth: 01-Sep-1975 Admit Type: Outpatient Age: 42 Room: [facility] ENDO ROOM 2 Gender: Male Note Status: Finalized Procedure:            Upper GI endoscopy Indications:          Cirrhosis rule out esophageal varices Providers:            Varnita B. Bonna Gains MD, MD Referring MD:         Janine Ores. Rosanna Randy, MD (Referring MD) Medicines:            Monitored Anesthesia Care Complications:        No immediate complications. Procedure:            Pre-Anesthesia Assessment:                       - Prior to the procedure, a History and Physical was                        performed, and patient medications, allergies and                        sensitivities were reviewed. The patient's tolerance of                        previous anesthesia was reviewed.                       - The risks and benefits of the procedure and the                        sedation options and risks were discussed with the                        patient. All questions were answered and informed                        consent was obtained.                       - Patient identification and proposed procedure were                        verified prior to the procedure by the physician, the                        nurse, the anesthesiologist, the anesthetist and the                        technician. The procedure was verified in the procedure                        room.                       - ASA Grade Assessment: II - A patient with mild                        systemic disease.  After obtaining informed consent, the endoscope was                        passed under direct vision. Throughout the procedure,                        the patient's blood pressure, pulse, and oxygen                        saturations were monitored continuously. The  Endoscope                        was introduced through the mouth, and advanced to the                        second part of duodenum. The upper GI endoscopy was                        accomplished with ease. The patient tolerated the                        procedure well. Findings:      The Z-line was regular.      There is no endoscopic evidence of varices in the entire esophagus.      A single area of ectopic gastric mucosa was found in the upper third of       the esophagus.      Two localized, 3 mm non-bleeding erosions were found in the gastric       antrum. There were no stigmata of recent bleeding. Biopsies were taken       with a cold forceps for histology.      Patchy mildly erythematous mucosa without bleeding was found in the       gastric antrum. Biopsies were taken with a cold forceps for histology.       Biopsies were obtained in the gastric body, at the incisura and in the       gastric antrum with cold forceps for histology.      The duodenal bulb, second portion of the duodenum and examined duodenum       were normal. Impression:           - Z-line regular.                       - Ectopic gastric mucosa in the upper third of the                        esophagus.                       - Non-bleeding erosive gastropathy. Biopsied.                       - Erythematous mucosa in the antrum. Biopsied.                       - Normal duodenal bulb, second portion of the duodenum                        and examined duodenum.                       -  Biopsies were obtained in the gastric body, at the                        incisura and in the gastric antrum. Recommendation:       - Await pathology results.                       - Discharge patient to home (with escort).                       - Advance diet as tolerated.                       - Continue present medications.                       - Patient has a contact number available for                        emergencies.  The signs and symptoms of potential                        delayed complications were discussed with the patient.                        Return to normal activities tomorrow. Written discharge                        instructions were provided to the patient.                       - Discharge patient to home (with escort).                       - The findings and recommendations were discussed with                        the patient.                       - The findings and recommendations were discussed with                        the patient's family.                       - Take prescribed proton pump inhibitor or H2 blocker                        (antacid) medications 30 - 60 minutes before meals.                       - Avoid NSAIDs except Aspirin if medically indicated Procedure Code(s):    --- Professional ---                       [phone], Esophagogastroduodenoscopy, flexible, transoral;                        with biopsy, single or multiple Diagnosis Code(s):    --- Professional ---  Q40.2, Other specified congenital malformations of                        stomach                       K31.89, Other diseases of stomach and duodenum                       K74.60, Unspecified cirrhosis of liver CPT copyright 2017 American Medical Association. All rights reserved. The codes documented in this report are preliminary and upon coder review may  be revised to meet current compliance requirements.  Vonda Antigua, MD Margretta Sidle B. Bonna Gains MD, MD 07/25/2017 8:17:14 AM This report has been signed electronically. Number of Addenda: 0 Note Initiated On: 07/25/2017 7:27 AM Estimated Blood Loss: Estimated blood loss: none.      [facility]

## 2017-07-25 NOTE — Transfer of Care (Signed)
Immediate Anesthesia Transfer of Care Note  Patient: Ryan Simmons  Procedure(s) Performed: COLONOSCOPY WITH PROPOFOL (N/A ) ESOPHAGOGASTRODUODENOSCOPY (EGD) WITH PROPOFOL (N/A )  Patient Location: Endoscopy Unit  Anesthesia Type:General  Level of Consciousness: awake and alert   Airway & Oxygen Therapy: Patient Spontanous Breathing  Post-op Assessment: Report given to RN and Post -op Vital signs reviewed and stable  Post vital signs: Reviewed and stable  Last Vitals:  Vitals Value Taken Time  BP 115/80 07/25/2017  9:06 AM  Temp    Pulse 96 07/25/2017  9:07 AM  Resp 19 07/25/2017  9:07 AM  SpO2 95 % 07/25/2017  9:07 AM  Vitals shown include unvalidated device data.  Last Pain:  Vitals:   07/25/17 0734  TempSrc: Tympanic  PainSc: 0-No pain         Complications: No apparent anesthesia complications

## 2017-07-27 NOTE — Progress Notes (Signed)
Non-identifying voicemail.  No message left.

## 2017-07-28 LAB — SURGICAL PATHOLOGY

## 2017-08-06 ENCOUNTER — Other Ambulatory Visit: Payer: Self-pay

## 2017-08-06 ENCOUNTER — Telehealth: Payer: Self-pay | Admitting: Family Medicine

## 2017-08-06 MED ORDER — AMOXICILLIN-POT CLAVULANATE 875-125 MG PO TABS: 1.0000 | ORAL_TABLET | Freq: Two times a day (BID) | ORAL | 0 refills | Status: DC

## 2017-08-06 NOTE — Telephone Encounter (Signed)
Patient had fever last night that broke.  He has chest congestion and cough but can't cough anything up.  I told him we should probably see him and he asked if I would check if you would call something in.

## 2017-08-06 NOTE — Telephone Encounter (Signed)
Augmentin if not febrile.

## 2017-08-06 NOTE — Telephone Encounter (Signed)
Pt is requesting Ryan Simmons return his call. Pt stated that he had a fever last night and has cold symptoms. Pt was offered an appt with another provider in the office. Pt stated that he only sees Dr. Rosanna Randy and requested that Ryan Simmons just return his call. Please advise. Thanks TNP

## 2017-10-10 DIAGNOSIS — Z79899 Other long term (current) drug therapy: Secondary | ICD-10-CM | POA: Diagnosis not present

## 2017-10-10 DIAGNOSIS — M1A09X0 Idiopathic chronic gout, multiple sites, without tophus (tophi): Secondary | ICD-10-CM | POA: Diagnosis not present

## 2017-10-10 DIAGNOSIS — K760 Fatty (change of) liver, not elsewhere classified: Secondary | ICD-10-CM | POA: Diagnosis not present

## 2017-10-21 ENCOUNTER — Encounter: Payer: Self-pay | Admitting: Gastroenterology

## 2017-10-21 ENCOUNTER — Ambulatory Visit: Payer: BLUE CROSS/BLUE SHIELD | Admitting: Gastroenterology

## 2017-10-21 VITALS — BP 127/81 | HR 114 | Ht 73.0 in | Wt 265.4 lb

## 2017-10-21 DIAGNOSIS — R7989 Other specified abnormal findings of blood chemistry: Secondary | ICD-10-CM | POA: Diagnosis not present

## 2017-10-21 DIAGNOSIS — K648 Other hemorrhoids: Secondary | ICD-10-CM

## 2017-10-21 DIAGNOSIS — K760 Fatty (change of) liver, not elsewhere classified: Secondary | ICD-10-CM

## 2017-10-21 NOTE — Progress Notes (Signed)
Vonda Antigua, MD [address]  [place]  [place],  [address]  Main: [phone]  Fax: [phone]   Primary Care Physician: Jerrol Banana., MD  Primary Gastroenterologist:  Dr. Vonda Antigua  Chief Complaint  Patient presents with  . Follow-up    henatochezia    HPI: Ryan Simmons is a 42 y.o. male here for follow-up of fatty liver.  Hematochezia is completely resolved. The patient denies abdominal or flank pain, anorexia, nausea or vomiting, dysphagia, change in bowel habits or black or bloody stools or weight loss.  Patient has been eating healthier and trying to lose weight since last visit, and has been successful in losing some weight.  EGD showed gastric erosions, ectopic gastric mucosa in the esophagus, biopsies were negative for H. pylori.  Colonoscopy showed thickened because of the ileocecal valve.  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  2 sigmoid colon polyps that were removed.  Ileocecal valve biopsy showed mildly increased chronic inflammation of the lamina propria, nonspecific.  My patchy lesion showed prominent lymphoid aggregate.  Colon polyps were hyperplastic.  He has history of ITP and is following with oncology in this regard.  Viral hepatitis testing was negative.  Current Outpatient Medications  Medication Sig Dispense Refill  . magnesium oxide (MAG-OX) 400 MG tablet Take 1 tablet (400 mg total) 2 (two) times daily by mouth. 60 tablet 2  . rosuvastatin (CRESTOR) 10 MG tablet TAKE 1 TABLET BY MOUTH AT BEDTIME 90 tablet 3  . ULORIC 80 MG TABS   0   No current facility-administered medications for this visit.     Allergies as of 10/21/2017  . (No Known Allergies)    ROS:  General: Negative for anorexia, weight loss, fever, chills, fatigue, weakness. ENT: Negative for hoarseness, difficulty swallowing , nasal congestion. CV: Negative for chest pain, angina, palpitations,  dyspnea on exertion, peripheral edema.  Respiratory: Negative for dyspnea at rest, dyspnea on exertion, cough, sputum, wheezing.  GI: See history of present illness. GU:  Negative for dysuria, hematuria, urinary incontinence, urinary frequency, nocturnal urination.  Endo: Negative for unusual weight change.    Physical Examination:   BP 127/81   Pulse (!) 114   Ht 6\' 1"  (1.854 m)   Wt 265 lb 6.4 oz (120.4 kg)   BMI 35.02 kg/m   General: Well-nourished, well-developed in no acute distress.  Eyes: No icterus. Conjunctivae pink. Mouth: Oropharyngeal mucosa moist and pink , no lesions erythema or exudate. Neck: Supple, Trachea midline Abdomen: Bowel sounds are normal, nontender, nondistended, no hepatosplenomegaly or masses, no abdominal bruits or hernia , no rebound or guarding.   Extremities: No lower extremity edema. No clubbing or deformities. Neuro: Alert and oriented x 3.  Grossly intact. Skin: Warm and dry, no jaundice.   Psych: Alert and cooperative, normal mood and affect.   Labs: CMP     Component Value Date/Time   NA 146 (H) 07/14/2017 0949   K 4.6 07/14/2017 0949   CL 106 07/14/2017 0949   CO2 22 07/14/2017 0949   GLUCOSE 114 (H) 07/14/2017 0949   GLUCOSE 121 (H) 01/08/2017 0843   BUN 21 07/14/2017 0949   CREATININE 0.92 07/14/2017 0949   CREATININE 0.92 01/08/2017 0843   CALCIUM 9.5 07/14/2017 0949   PROT 7.0 07/14/2017 0949   ALBUMIN 4.6 07/14/2017 0949   AST 34 07/14/2017 0949   ALT 54 (H) 07/14/2017 0949   ALKPHOS 47 07/14/2017 0949  BILITOT 0.6 07/14/2017 0949   GFRNONAA 102 07/14/2017 0949   GFRNONAA 103 01/08/2017 0843   GFRAA 118 07/14/2017 0949   GFRAA 119 01/08/2017 0843   Lab Results  Component Value Date   WBC 9.2 07/18/2017   HGB 16.4 07/18/2017   HCT 47.0 07/18/2017   MCV 85.1 07/18/2017   PLT 142 (L) 07/18/2017    Imaging Studies: No results found.  Assessment and Plan:   Ryan Simmons is a 42 y.o. y/o male here for follow-up  of history of fatty liver hematochezia  Hematochezia resolved and likely due to internal hemorrhoids Continue high-fiber diet Use MiraLAX daily if not at goal of 1-2 soft bowel movements daily  Finding of fatty liver on imaging discussed with patient Diet, weight loss, and exercise encouraged along with avoiding hepatotoxic drugs including alcohol Risk of progression to cirrhosis if above measures are not instituted were discussed as well, and patient verbalized understanding Patient states he will be getting hepatitis A and B vaccination with his primary care provider.  His sodium was mildly elevated on last testing, will repeat, possibly due to dehydration of the time Good hydration good nutrition encouraged Follow-up with [place] as needed.  Follow-up with primary care provider as scheduled  Screening colonoscopy at 42 years of age discussed with patient again and he verbalized understanding  Dr Vonda Antigua

## 2017-10-21 NOTE — Patient Instructions (Signed)
F/U as needed  Fatty Liver Fatty liver, also called hepatic steatosis or steatohepatitis, is a condition in which too much fat has built up in your liver cells. The liver removes harmful substances from your bloodstream. It produces fluids your body needs. It also helps your body use and store energy from the food you eat. In many cases, fatty liver does not cause symptoms or problems. It is often diagnosed when tests are being done for other reasons. However, over time, fatty liver can cause inflammation that may lead to more serious liver problems, such as scarring of the liver (cirrhosis). What are the causes? Causes of fatty liver may include:  Drinking too much alcohol.  Poor nutrition.  Obesity.  Cushing syndrome.  Diabetes.  Hyperlipidemia.  Pregnancy.  Certain drugs.  Poisons.  Some viral infections.  What increases the risk? You may be more likely to develop fatty liver if you:  Abuse alcohol.  Are pregnant.  Are overweight.  Have diabetes.  Have hepatitis.  Have a high triglyceride level.  What are the signs or symptoms? Fatty liver often does not cause any symptoms. In cases where symptoms develop, they can include:  Fatigue.  Weakness.  Weight loss.  Confusion.  Abdominal pain.  Yellowing of your skin and the white parts of your eyes (jaundice).  Nausea and vomiting.  How is this diagnosed? Fatty liver may be diagnosed by:  Physical exam and medical history.  Blood tests.  Imaging tests, such as an ultrasound, CT scan, or MRI.  Liver biopsy. A small sample of liver tissue is removed using a needle. The sample is then looked at under a microscope.  How is this treated? Fatty liver is often caused by other health conditions. Treatment for fatty liver may involve medicines and lifestyle changes to manage conditions such as:  Alcoholism.  High cholesterol.  Diabetes.  Being overweight or obese.  Follow these instructions at  home:  Eat a healthy diet as directed by your health care provider.  Exercise regularly. This can help you lose weight and control your cholesterol and diabetes. Talk to your health care provider about an exercise plan and which activities are best for you.  Do not drink alcohol.  Take medicines only as directed by your health care provider. Contact a health care provider if: You have difficulty controlling your:  Blood sugar.  Cholesterol.  Alcohol consumption.  Get help right away if:  You have abdominal pain.  You have jaundice.  You have nausea and vomiting. This information is not intended to replace advice given to you by your health care provider. Make sure you discuss any questions you have with your health care provider. Document Released: 03/08/2005 Document Revised: 06/29/2015 Document Reviewed: 06/02/2013 Elsevier Interactive Patient Education  [employer].

## 2017-10-24 LAB — COMPREHENSIVE METABOLIC PANEL
A/G RATIO: 1.5 (ref 1.2–2.2)
ALT: 73 IU/L — AB (ref 0–44)
AST: 40 IU/L (ref 0–40)
Albumin: 4.4 g/dL (ref 3.5–5.5)
Alkaline Phosphatase: 55 IU/L (ref 39–117)
BUN/Creatinine Ratio: 22 — ABNORMAL HIGH (ref 9–20)
BUN: 25 mg/dL — AB (ref 6–24)
Bilirubin Total: 0.9 mg/dL (ref 0.0–1.2)
CALCIUM: 9.3 mg/dL (ref 8.7–10.2)
CO2: 25 mmol/L (ref 20–29)
CREATININE: 1.14 mg/dL (ref 0.76–1.27)
Chloride: 101 mmol/L (ref 96–106)
GFR calc Af Amer: 91 mL/min/{1.73_m2} (ref >59)
GFR, EST NON AFRICAN AMERICAN: 79 mL/min/{1.73_m2} (ref >59)
GLUCOSE: 189 mg/dL — AB (ref 65–99)
Globulin, Total: 2.9 g/dL (ref 1.5–4.5)
Potassium: 4.3 mmol/L (ref 3.5–5.2)
Sodium: 141 mmol/L (ref 134–144)
TOTAL PROTEIN: 7.3 g/dL (ref 6.0–8.5)

## 2017-10-24 LAB — MITOCHONDRIAL/SMOOTH MUSCLE AB PNL: Smooth Muscle Ab: 17 Units (ref 0–19)

## 2017-10-24 LAB — FERRITIN: FERRITIN: 594 ng/mL — AB (ref 30–400)

## 2017-10-24 LAB — CERULOPLASMIN: Ceruloplasmin: 21.7 mg/dL (ref 16.0–31.0)

## 2017-10-24 NOTE — Addendum Note (Signed)
Addended by: Vonda Antigua on: 10/24/2017 09:26 AM   Modules accepted: Orders

## 2017-10-28 ENCOUNTER — Telehealth: Payer: Self-pay

## 2017-10-28 NOTE — Telephone Encounter (Signed)
I spoke with the patient, he is working out of town.  When he gets in town he will call me and we will arrange for him to come in and get his flu and Hep B shot.  [name]  ----- Message from Jerrol Banana., MD sent at 10/24/2017 10:06 AM EDT ----- Will do--thank you. ----- Message ----- From: Virgel Manifold, MD Sent: 10/24/2017   9:29 AM EDT To: Jerrol Banana., MD  Booster with repeat titers as per protocol would likely work. He should get his pneumonia vaccines every 5 yrs for his asplenia too. Thank you!  ----- Message ----- From: Jerrol Banana., MD Sent: 10/22/2017  10:03 AM EDT To: Juluis Mire, CMA, Virgel Manifold, MD  Without spleen do you think he needs whole series repeated. Generally would do single booster. We will do whatever you think is best. ----- Message ----- From: Virgel Manifold, MD Sent: 10/22/2017   8:12 AM EDT To: Jerrol Banana., MD, #  I see under his immunizations that he had these series a few years ago. Did he have it again this year? His HepBsAb and HepAAb from June 2019 show that he is not immune.   ----- Message ----- From: Juluis Mire, CMA Sent: 10/21/2017   4:53 PM EDT To: Jerrol Banana., MD, #  Patient is up to date with completed series for both these immunizations [name]   ----- Message ----- From: Jerrol Banana., MD Sent: 10/21/2017   3:30 PM EDT To: Juluis Mire, CMA, Virgel Manifold, MD  We will make sure--thank you. ----- Message ----- From: Virgel Manifold, MD Sent: 10/21/2017   2:47 PM EDT To: Jerrol Banana., MD  Hi Dr. Rosanna Randy,  I have asked this patient to follow-up with you to get his hepatitis A and B vaccination.  He states he might have already been started on the series, but just wanted to ensure he is getting this.  Thank you!

## 2017-10-28 NOTE — Telephone Encounter (Signed)
-----   Message from Jerrol Banana., MD sent at 10/24/2017 10:06 AM EDT ----- Will do--thank you. ----- Message ----- From: Virgel Manifold, MD Sent: 10/24/2017   9:29 AM EDT To: Jerrol Banana., MD  Booster with repeat titers as per protocol would likely work. He should get his pneumonia vaccines every 5 yrs for his asplenia too. Thank you!  ----- Message ----- From: Jerrol Banana., MD Sent: 10/22/2017  10:03 AM EDT To: Juluis Mire, CMA, Virgel Manifold, MD  Without spleen do you think he needs whole series repeated. Generally would do single booster. We will do whatever you think is best. ----- Message ----- From: Virgel Manifold, MD Sent: 10/22/2017   8:12 AM EDT To: Jerrol Banana., MD, #  I see under his immunizations that he had these series a few years ago. Did he have it again this year? His HepBsAb and HepAAb from June 2019 show that he is not immune.   ----- Message ----- From: Juluis Mire, CMA Sent: 10/21/2017   4:53 PM EDT To: Jerrol Banana., MD, #  Patient is up to date with completed series for both these immunizations [name]   ----- Message ----- From: Jerrol Banana., MD Sent: 10/21/2017   3:30 PM EDT To: Juluis Mire, CMA, Virgel Manifold, MD  We will make sure--thank you. ----- Message ----- From: Virgel Manifold, MD Sent: 10/21/2017   2:47 PM EDT To: Jerrol Banana., MD  Hi Dr. Rosanna Randy,  I have asked this patient to follow-up with you to get his hepatitis A and B vaccination.  He states he might have already been started on the series, but just wanted to ensure he is getting this.  Thank you!

## 2017-11-07 ENCOUNTER — Telehealth: Payer: Self-pay

## 2017-11-07 ENCOUNTER — Other Ambulatory Visit: Payer: Self-pay

## 2017-11-07 DIAGNOSIS — R7989 Other specified abnormal findings of blood chemistry: Secondary | ICD-10-CM

## 2017-11-07 NOTE — Telephone Encounter (Signed)
Pt to stop by next week to have labs drawn.

## 2017-11-07 NOTE — Telephone Encounter (Signed)
-----   Message from Virgel Manifold, MD sent at 11/06/2017  4:09 PM EDT ----- Jackelyn Poling this patient's ferritin was elevated and we ordered hemochromatosis testing.  I do not see that this was done, can you please follow-up.

## 2017-11-10 ENCOUNTER — Other Ambulatory Visit: Payer: Self-pay | Admitting: Family Medicine

## 2017-11-10 NOTE — Telephone Encounter (Signed)
[facility] faxed refill request for the following medications:  ULORIC 80 MG TABS   Take 1 tablet daily  Qty: 30  Please advise.

## 2017-11-12 MED ORDER — ULORIC 80 MG PO TABS: 80.0000 mg | ORAL_TABLET | Freq: Every day | ORAL | 0 refills | Status: DC

## 2018-04-29 ENCOUNTER — Telehealth: Payer: Self-pay

## 2018-04-29 MED ORDER — AMOXICILLIN 500 MG PO CAPS: 500.0000 mg | ORAL_CAPSULE | Freq: Two times a day (BID) | ORAL | 0 refills | Status: DC

## 2018-04-29 NOTE — Telephone Encounter (Signed)
Patient called with concern and question as to whether or not he and his brother should be on the PCN daily.  He states when he had his spleen removed they told him that he should.  When his brother had his removed they said they probably didn't need to do it.  The patient states that the doctors have gone back and forth over the years.   With everything that is going on he just wanted to check and make sure.  He is not sick and feels fine but wanted to check. Please advise

## 2018-04-29 NOTE — Telephone Encounter (Signed)
Patient advised and Rx sent to pharmacy 

## 2018-04-29 NOTE — Telephone Encounter (Signed)
Patient is requesting a call back from [place]. Would not leave any further details. CB# [phone]

## 2018-04-29 NOTE — Telephone Encounter (Signed)
Okay to stop daily antibiotic.  Please send him in a prescription for amoxicillin 500 mg 2 p.o. twice daily, #28. This is just in case he gets some sort of illness and fever on the weekend he has a start taking before he can be seen the next week

## 2018-07-02 ENCOUNTER — Other Ambulatory Visit: Payer: Self-pay | Admitting: Family Medicine

## 2018-07-31 ENCOUNTER — Telehealth: Payer: Self-pay

## 2018-07-31 ENCOUNTER — Encounter: Payer: Self-pay | Admitting: Family Medicine

## 2018-07-31 ENCOUNTER — Other Ambulatory Visit: Payer: Self-pay

## 2018-07-31 ENCOUNTER — Ambulatory Visit (INDEPENDENT_AMBULATORY_CARE_PROVIDER_SITE_OTHER): Payer: BC Managed Care – PPO | Admitting: Family Medicine

## 2018-07-31 VITALS — Temp 100.0°F

## 2018-07-31 DIAGNOSIS — A689 Relapsing fever, unspecified: Secondary | ICD-10-CM

## 2018-07-31 DIAGNOSIS — R6889 Other general symptoms and signs: Secondary | ICD-10-CM | POA: Diagnosis not present

## 2018-07-31 DIAGNOSIS — R519 Headache, unspecified: Secondary | ICD-10-CM

## 2018-07-31 DIAGNOSIS — R51 Headache: Secondary | ICD-10-CM | POA: Diagnosis not present

## 2018-07-31 DIAGNOSIS — Z20822 Contact with and (suspected) exposure to covid-19: Secondary | ICD-10-CM

## 2018-07-31 DIAGNOSIS — Z9081 Acquired absence of spleen: Secondary | ICD-10-CM

## 2018-07-31 NOTE — Telephone Encounter (Signed)
Pt mother called stating that patient was seen at his [place] office PCP and needs scheduled for COVID-19 testing per Vernie Murders PA at [facility].  Pt was on speaker phone and ok'd me speaking and setting his appointment with his mother. Appointment scheduled and order placed.  [facility] [address] [place], [place]  [address] Phone:  [phone]   Fax:  [phone]  July 31, 2018  Patient: Ryan Simmons  MRN: [id number]  Date of Birth: [birth year]  Date of Visit: 07/31/2018    To Whom It May Concern:  This 43 year old male has developed daily morning headaches with fever of 100-100.5 for the past 3 days. Some general malaise but no significant cough or dyspnea expressed. With his high risk of infection from past splenectomy for ITP and cirrhosis without ascites, I feel he should be screened for COVID-19.   Sincerely,  Vickki Muff Chrismon PA-C

## 2018-07-31 NOTE — Telephone Encounter (Signed)
Patient calling that he woke up with a temperature of 100 today reports that he developed a fever on Wednesday and yesterday it broke but now he has the temperature of 100. Patient reports that he doesn't have a spleen.  Headache since Wednesday. Patient took Advil. Scheduled patient for a Virtual visit with you at 3 pm today. He is a doctor Surveyor, quantity patient.

## 2018-07-31 NOTE — Telephone Encounter (Signed)
Patient was advised.  

## 2018-07-31 NOTE — Telephone Encounter (Signed)
Agree with virtual visit today. He should isolate himself at home today.

## 2018-07-31 NOTE — Progress Notes (Signed)
Patient: Ryan Simmons Male    DOB: [birth year]   43 y.o.   MRN: [id number] Visit Date: 07/31/2018  Today's Provider: Vernie Murders, PA   Virtual Visit via Video Note  I connected with Ryan Simmons on 07/31/18 at  3:00 PM EDT by a video enabled telemedicine application and verified that I am speaking with the correct person using two identifiers.  Location: Patient: Home Provider: Office   I discussed the limitations of evaluation and management by telemedicine and the availability of in person appointments. The patient expressed understanding and agreed to proceed.   Subjective:     HPI  This 43 year old male (with a history of splenectomy for ITP at age 65 and meningitis at age 89) started having headaches with fever 100.5 on 07-29-18 morning. Denies sore throat, earache, cough, rash or nasal congestion. Some general malaise with fever and headache each morning the past 3 days that abates in the afternoon. No dyspnea or GI upset. Remembers having a tick bite 2-3 weeks ago without fever or rash at that time.  Past Surgical History:  Procedure Laterality Date  . COLONOSCOPY WITH PROPOFOL N/A 07/25/2017   Procedure: COLONOSCOPY WITH PROPOFOL;  Surgeon: Virgel Manifold, MD;  Location: [facility];  Service: Endoscopy;  Laterality: N/A;  . ESOPHAGOGASTRODUODENOSCOPY (EGD) WITH PROPOFOL N/A 07/25/2017   Procedure: ESOPHAGOGASTRODUODENOSCOPY (EGD) WITH PROPOFOL;  Surgeon: Virgel Manifold, MD;  Location: [facility] END[facility]COPY;  Service: Endoscopy;  Laterality: N/A;  . KNEE SURGERY Left   . SHOULDER SURGERY Right   . SPLENECTOMY     Past Medical History:  Diagnosis Date  . Allergy   . Anemia   . Gout   . Gout   . Hyperlipemia   . Rocky Mountain spotted fever    Patient Active Problem List   Diagnosis Date Noted  . Intestinal lump   . Polyp of sigmoid colon   . Internal hemorrhoids   . Hematochezia   . Diverticulosis of large intestine without diverticulitis    . Ectopic gastric mucosa   . Stomach irritation   . Cirrhosis of liver without ascites ([place])   . Absolute anemia 10/27/2014  . Ankle pain 10/27/2014  . Pneumonia, organism unspecified(486) 10/27/2014  . Cough, persistent 10/27/2014  . Gout 10/27/2014  . H/O splenectomy 10/27/2014  . Insomnia 10/27/2014  . Leukocytosis 10/27/2014  . Shingles 10/27/2014  . Immune thrombocytopenic purpura ([place]) 08/14/2009  . HLD (hyperlipidemia) 08/01/2009  . Current tobacco use 03/29/2008   Family History  Problem Relation Age of Onset  . Gout Father   . Heart disease Father        atrial fib  . Healthy Mother   . Healthy Brother    No Known Allergies  Current Outpatient Medications:  .  magnesium oxide (MAG-OX) 400 MG tablet, Take 1 tablet (400 mg total) 2 (two) times daily by mouth., Disp: 60 tablet, Rfl: 2 .  rosuvastatin (CRESTOR) 10 MG tablet, TAKE ONE TABLET BY MOUTH AT BEDTIME, Disp: 90 tablet, Rfl: 3 .  ULORIC 80 MG TABS, Take 1 tablet (80 mg total) by mouth daily., Disp: 30 tablet, Rfl: 0 .  amoxicillin (AMOXIL) 500 MG capsule, Take 1 capsule (500 mg total) by mouth 2 (two) times daily. (Patient not taking: Reported on 07/31/2018), Disp: 28 capsule, Rfl: 0  Review of Systems  Constitutional: Positive for fever. Negative for appetite change and chills.  HENT: Negative for congestion, ear pain and sore throat.  Respiratory: Negative for cough, chest tightness, shortness of breath and wheezing.   Cardiovascular: Negative for chest pain and palpitations.  Gastrointestinal: Negative for abdominal pain, nausea and vomiting.  Neurological: Positive for headaches.   Social History   Tobacco Use  . Smoking status: Never Smoker  . Smokeless tobacco: Current User    Types: Chew  Substance Use Topics  . Alcohol use: Yes    Alcohol/week: 0.0 standard drinks    Comment: Social      Objective:   Temp 100 F (37.8 C) (Oral)  Vitals:   07/31/18 1124  Temp: 100 F (37.8 C)  TempSrc:  Oral   WDWN male in no apparent distress.  Head: Normocephalic, atraumatic. Neck: Supple, NROM Respiratory: No apparent distress Psych: Normal mood and affect     Assessment & Plan    1. Recurrent fever Has had fever each morning for the past 3 days with headaches. No sore throat, nasal congestion, rash, significant cough, dyspnea or GI up set. States he has had a good appetite and cannot remember being around anyone that has been sick recently. Recommend he isolate at home and get COVID testing.  2. Headaches above the eye region Developed headache above eyes with fever 100-100.5 the past 3 mornings. No nausea or vomiting. No nuchal rigidity and headache resolves when fever breaks. No neurological deficit or rash. Remembers a tick bite 3 weeks ago without any symptoms. Recommend screening for COVID-19.  3. Suspected Covid-19 Virus Infection Fever for the past 3 days with headaches and general malaise. Recommend testing for COVID-19 and set up for temperature monitoring with PEC monitoring of symptoms. Advised to isolate at home until free of fever for 3 days without antipyretics. May use cough or congestion OTC medication if needed and Tylenol or Advil for headache or fever. Increase fluid intake and contact office or go to ER if fever or respiratory symptoms progress.  4. History of splenectomy Has spleen removed at age 48 due to idiopathic thrombocytopenic purpura. Always at higher risk for pneumonia and other infections. May start Amoxicillin 500 mg BID he has at home.       I discussed the assessment and treatment plan with the patient. The patient was provided an opportunity to ask questions and all were answered. The patient agreed with the plan and demonstrated an understanding of the instructions.   The patient was advised to call back or seek an in-person evaluation if the symptoms worsen or if the condition fails to improve as anticipated.  I provided 11 minutes of  non-face-to-face time during this encounter.   Vernie Murders, PA  [facility]

## 2018-08-02 DIAGNOSIS — Z20828 Contact with and (suspected) exposure to other viral communicable diseases: Secondary | ICD-10-CM | POA: Diagnosis not present

## 2018-08-03 ENCOUNTER — Other Ambulatory Visit: Payer: BLUE CROSS/BLUE SHIELD

## 2018-08-03 ENCOUNTER — Telehealth: Payer: Self-pay | Admitting: Family Medicine

## 2018-08-03 NOTE — Telephone Encounter (Signed)
Please review. Thanks!  

## 2018-08-03 NOTE — Telephone Encounter (Signed)
Doxycycline 100mg BID for 10 days

## 2018-08-03 NOTE — Telephone Encounter (Signed)
pt called saying he has a temp of 99.  He said he spoke with Dr. Darnell Level over the weekend and he had suggested that he take another antibiotic incase he has tick fever.  He is still not feeling well.  Headache & fever  CB#  [phone]  [facility] Thanks Con Memos

## 2018-08-04 ENCOUNTER — Encounter: Payer: Self-pay | Admitting: Family Medicine

## 2018-08-04 ENCOUNTER — Other Ambulatory Visit: Payer: Self-pay

## 2018-08-04 MED ORDER — DOXYCYCLINE HYCLATE 100 MG PO TABS: 100.0000 mg | ORAL_TABLET | Freq: Two times a day (BID) | ORAL | 0 refills | Status: DC

## 2019-02-23 DIAGNOSIS — K760 Fatty (change of) liver, not elsewhere classified: Secondary | ICD-10-CM | POA: Diagnosis not present

## 2019-02-23 DIAGNOSIS — Z79899 Other long term (current) drug therapy: Secondary | ICD-10-CM | POA: Diagnosis not present

## 2019-02-23 DIAGNOSIS — M1A09X0 Idiopathic chronic gout, multiple sites, without tophus (tophi): Secondary | ICD-10-CM | POA: Diagnosis not present

## 2019-09-24 DIAGNOSIS — D0439 Carcinoma in situ of skin of other parts of face: Secondary | ICD-10-CM | POA: Diagnosis not present

## 2019-09-24 DIAGNOSIS — D485 Neoplasm of uncertain behavior of skin: Secondary | ICD-10-CM | POA: Diagnosis not present

## 2019-09-24 DIAGNOSIS — L570 Actinic keratosis: Secondary | ICD-10-CM | POA: Diagnosis not present

## 2019-09-24 DIAGNOSIS — D0421 Carcinoma in situ of skin of right ear and external auricular canal: Secondary | ICD-10-CM | POA: Diagnosis not present

## 2019-09-24 DIAGNOSIS — D2371 Other benign neoplasm of skin of right lower limb, including hip: Secondary | ICD-10-CM | POA: Diagnosis not present

## 2019-09-24 DIAGNOSIS — D045 Carcinoma in situ of skin of trunk: Secondary | ICD-10-CM | POA: Diagnosis not present

## 2019-11-03 DIAGNOSIS — D0439 Carcinoma in situ of skin of other parts of face: Secondary | ICD-10-CM | POA: Diagnosis not present

## 2019-11-03 DIAGNOSIS — D045 Carcinoma in situ of skin of trunk: Secondary | ICD-10-CM | POA: Diagnosis not present

## 2019-11-03 DIAGNOSIS — L570 Actinic keratosis: Secondary | ICD-10-CM | POA: Diagnosis not present

## 2019-12-02 DIAGNOSIS — D0421 Carcinoma in situ of skin of right ear and external auricular canal: Secondary | ICD-10-CM | POA: Diagnosis not present

## 2019-12-02 DIAGNOSIS — L578 Other skin changes due to chronic exposure to nonionizing radiation: Secondary | ICD-10-CM | POA: Diagnosis not present

## 2019-12-02 DIAGNOSIS — L814 Other melanin hyperpigmentation: Secondary | ICD-10-CM | POA: Diagnosis not present

## 2019-12-02 DIAGNOSIS — L988 Other specified disorders of the skin and subcutaneous tissue: Secondary | ICD-10-CM | POA: Diagnosis not present

## 2020-02-10 DIAGNOSIS — Z85828 Personal history of other malignant neoplasm of skin: Secondary | ICD-10-CM | POA: Diagnosis not present

## 2021-01-01 ENCOUNTER — Ambulatory Visit: Payer: BC Managed Care – PPO | Admitting: Family Medicine

## 2021-01-01 ENCOUNTER — Ambulatory Visit: Payer: Self-pay

## 2021-01-01 ENCOUNTER — Other Ambulatory Visit: Payer: Self-pay

## 2021-01-01 ENCOUNTER — Encounter: Payer: Self-pay | Admitting: Family Medicine

## 2021-01-01 VITALS — BP 143/95 | HR 83 | Temp 98.9°F | Ht 73.0 in | Wt 256.0 lb

## 2021-01-01 DIAGNOSIS — J019 Acute sinusitis, unspecified: Secondary | ICD-10-CM

## 2021-01-01 DIAGNOSIS — R002 Palpitations: Secondary | ICD-10-CM

## 2021-01-01 DIAGNOSIS — R0789 Other chest pain: Secondary | ICD-10-CM

## 2021-01-01 MED ORDER — AMOXICILLIN-POT CLAVULANATE 875-125 MG PO TABS: 1.0000 | ORAL_TABLET | Freq: Two times a day (BID) | ORAL | 1 refills | Status: DC

## 2021-01-01 NOTE — Patient Instructions (Signed)
TAKE ASPIRIN 81 MG DAILY.

## 2021-01-01 NOTE — Progress Notes (Signed)
Acute Office Visit  Subjective:    Patient ID: Ryan Simmons, male    DOB: Mar 03, 1975, 45 y.o.   MRN: [id number]  Chief Complaint  Patient presents with   Fever    100.6 F yesterday, relieved with Tylenol   Cough    Productive   Nasal Congestion   Irregular Heart Beat    Family history of A-fib; one episode of irregular racing heart rate lasting 30-45 minutes   Headache    Worse when coughing    HPI Patient is in today for acute illness.  He is asplenic comes in today with 2 days of fever up to 100.6 cough body aches and headache.  He had a negative home COVID test.  He does have sinus and chest congestion. Before this he was not feeling well and 4 days ago he had rapid heart rate of up about 30 to 40 minutes which made him feel lightheaded.  No syncope and no chest pain or shortness of breath.  He has had no recurrence of this.  Past Medical History:  Diagnosis Date   Allergy    Anemia    Gout    Gout    Hyperlipemia    Rocky Mountain spotted fever     Past Surgical History:  Procedure Laterality Date   COLONOSCOPY WITH PROPOFOL N/A 07/25/2017   Procedure: COLONOSCOPY WITH PROPOFOL;  Surgeon: Virgel Manifold, MD;  Location: [facility];  Service: Endoscopy;  Laterality: N/A;   ESOPHAGOGASTRODUODENOSCOPY (EGD) WITH PROPOFOL N/A 07/25/2017   Procedure: ESOPHAGOGASTRODUODENOSCOPY (EGD) WITH PROPOFOL;  Surgeon: Virgel Manifold, MD;  Location: [facility];  Service: Endoscopy;  Laterality: N/A;   KNEE SURGERY Left    SHOULDER SURGERY Right    SPLENECTOMY      Family History  Problem Relation Age of Onset   Gout Father    Heart disease Father        atrial fib   Healthy Mother    Healthy Brother     Social History   Socioeconomic History   Marital status: Single    Spouse name: Not on file   Number of children: 0   Years of education: 16   Highest education level: Bachelor's degree (e.g., BA, AB, BS)  Occupational History    Employer: [employer]  Tobacco Use   Smoking status: Never   Smokeless tobacco: Current    Types: Chew  Vaping Use   Vaping Use: Never used  Substance and Sexual Activity   Alcohol use: Yes   Drug use: Never   Sexual activity: Yes    Partners: Female  Other Topics Concern   Not on file  Social History Narrative   Not on file   Social Determinants of Health   Financial Resource Strain: Not on file  Food Insecurity: Not on file  Transportation Needs: Not on file  Physical Activity: Not on file  Stress: Not on file  Social Connections: Not on file  Intimate Partner Violence: Not on file    Outpatient Medications Prior to Visit  Medication Sig Dispense Refill   rosuvastatin (CRESTOR) 10 MG tablet TAKE ONE TABLET BY MOUTH AT BEDTIME (Patient not taking: Reported on 01/01/2021) 90 tablet 3   ULORIC 80 MG TABS Take 1 tablet (80 mg total) by mouth daily. (Patient not taking: Reported on 01/01/2021) 30 tablet 0   amoxicillin (AMOXIL) 500 MG capsule Take 1 capsule (500 mg total) by mouth 2 (two) times daily. (Patient not taking: Reported  on 07/31/2018) 28 capsule 0   doxycycline (VIBRA-TABS) 100 MG tablet Take 1 tablet (100 mg total) by mouth 2 (two) times daily. 20 tablet 0   magnesium oxide (MAG-OX) 400 MG tablet Take 1 tablet (400 mg total) 2 (two) times daily by mouth. 60 tablet 2   No facility-administered medications prior to visit.    No Known Allergies  Review of Systems     Objective:    Physical Exam Vitals reviewed.  Constitutional:      General: He is not in acute distress.    Appearance: He is well-developed.     Comments: He is in no acute distress.  HENT:     Head: Normocephalic and atraumatic.     Right Ear: Hearing normal.     Left Ear: Hearing normal.     Nose: Nose normal.  Eyes:     General: Lids are normal. No scleral icterus.       Right eye: No discharge.        Left eye: No discharge.     Conjunctiva/sclera: Conjunctivae normal.  Cardiovascular:     Rate  and Rhythm: Normal rate and regular rhythm.     Heart sounds: Normal heart sounds.  Pulmonary:     Effort: Pulmonary effort is normal. No respiratory distress.  Abdominal:     Palpations: Abdomen is soft.  Musculoskeletal:     Cervical back: Normal range of motion and neck supple. No rigidity.  Skin:    General: Skin is warm and dry.     Findings: No lesion or rash.  Neurological:     General: No focal deficit present.     Mental Status: He is alert and oriented to person, place, and time.  Psychiatric:        Mood and Affect: Mood normal.        Speech: Speech normal.        Behavior: Behavior normal.        Thought Content: Thought content normal.        Judgment: Judgment normal.    Ht 6\' 1"  (1.854 m)   Wt 256 lb (116.1 kg)   BMI 33.78 kg/m  Wt Readings from Last 3 Encounters:  01/01/21 256 lb (116.1 kg)  10/21/17 265 lb 6.4 oz (120.4 kg)  07/25/17 260 lb (117.9 kg)    Health Maintenance Due  Topic Date Due   COVID-19 Vaccine (1) Never done   HIV Screening  Never done   INFLUENZA VACCINE  09/04/2020    There are no preventive care reminders to display for this patient.   Lab Results  Component Value Date   TSH 2.300 05/23/2016   Lab Results  Component Value Date   WBC 9.2 07/18/2017   HGB 16.4 07/18/2017   HCT 47.0 07/18/2017   MCV 85.1 07/18/2017   PLT 142 (L) 07/18/2017   Lab Results  Component Value Date   NA 141 10/21/2017   K 4.3 10/21/2017   CO2 25 10/21/2017   GLUCOSE 189 (H) 10/21/2017   BUN 25 (H) 10/21/2017   CREATININE 1.14 10/21/2017   BILITOT 0.9 10/21/2017   ALKPHOS 55 10/21/2017   AST 40 10/21/2017   ALT 73 (H) 10/21/2017   PROT 7.3 10/21/2017   ALBUMIN 4.4 10/21/2017   CALCIUM 9.3 10/21/2017   Lab Results  Component Value Date   CHOL 156 01/08/2017   Lab Results  Component Value Date   HDL 29 (L) 01/08/2017   Lab Results  Component Value Date   LDLCALC 97 01/08/2017   Lab Results  Component Value Date   TRIG 204  (H) 01/08/2017   Lab Results  Component Value Date   CHOLHDL 5.4 (H) 01/08/2017   Lab Results  Component Value Date   HGBA1C 5.7 (H) 01/08/2017  ECG reveals normal sinus rhythm rate of about 65 with no ischemic changes. O2 sat is 97% today and he is in no acute distress    Assessment & Plan:   Problem List Items Addressed This Visit   None  1. Acute non-recurrent sinusitis, unspecified location Sinusitis and URI symptoms and asplenic patient.  He is afebrile but due to his history we will treat him with Augmentin for 10 days.  He is trying to get in to get established with Dr. Leonel Ramsay for his primary care who is also an ID specialist and an outstanding physician.  He will let us know if he feels worse in the interim.  We did not have access to flu testing PCR COVID testing at this time.  Home COVID was negative. - amoxicillin-clavulanate (AUGMENTIN) 875-125 MG tablet; Take 1 tablet by mouth 2 (two) times daily.  Dispense: 20 tablet; Refill: 1  2. Palpitations Normal sinus rhythm today.  Father has history of A. fib.  At this time we will start baby aspirin daily pending cardiology referral.  3. Chest tightness He had a very short period of time where he felt a little weak on Thanksgiving day.  No evidence of ischemic disease.  Refer to cardiology. - EKG 12-Lead - Ambulatory referral to Cardiology     Forbes Cellar, CMA

## 2021-01-01 NOTE — Telephone Encounter (Signed)
Patient advised.

## 2021-01-01 NOTE — Telephone Encounter (Signed)
Reason for Disposition  [1] Heart beating very rapidly (e.g., > 140 / minute) AND [2] not present now  (Exception: during exercise)  Answer Assessment - Initial Assessment Questions 1. DESCRIPTION: "Please describe your heart rate or heartbeat that you are having" (e.g., fast/slow, regular/irregular, skipped or extra beats, "palpitations")     Thinks it may have been a-fib 2. ONSET: "When did it start?" (Minutes, hours or days)      Over the weekend for 30-45 minutes 3. DURATION: "How long does it last" (e.g., seconds, minutes, hours)     See above 4. PATTERN "Does it come and go, or has it been constant since it started?"  "Does it get worse with exertion?"   "Are you feeling it now?"     One incident 5. TAP: "Using your hand, can you tap out what you are feeling on a chair or table in front of you, so that I can hear?" (Note: not all patients can do this)       na 6. HEART RATE: "Can you tell me your heart rate?" "How many beats in 15 seconds?"  (Note: not all patients can do this)       no 7. RECURRENT SYMPTOM: "Have you ever had this before?" If Yes, ask: "When was the last time?" and "What happened that time?"      no 8. CAUSE: "What do you think is causing the palpitations?"     A-fib 9. CARDIAC HISTORY: "Do you have any history of heart disease?" (e.g., heart attack, angina, bypass surgery, angioplasty, arrhythmia)      no 10. OTHER SYMPTOMS: "Do you have any other symptoms?" (e.g., dizziness, chest pain, sweating, difficulty breathing)       no 11. PREGNANCY: "Is there any chance you are pregnant?" "When was your last menstrual period?"       na  Protocols used: Heart Rate and Heartbeat Questions-A-AH

## 2021-01-01 NOTE — Telephone Encounter (Signed)
Pt called. He believes he had 30-45 minutes of A-fib over the weekend. Which has resolved.   Pt is very concerned, and would like to see Dr. Rosanna Randy ASAP. Dr. Rosanna Randy is a family friend.  Pt also has fever, congestion, and cough. And usually needs an office visit for any medical problems d/t missing spleen.  Please advise.

## 2021-01-02 ENCOUNTER — Telehealth: Payer: Self-pay

## 2021-01-02 NOTE — Telephone Encounter (Signed)
Copied from [phone]. Topic: Referral - Status >> Jan 02, 2021 11:49 AM Robina Ade, Helene Kelp D wrote: Reason for CRM: Patient called and would like to been seen at Dr. Thompson Grayer office in [place] on [address] [place] [phone]. Patients father goes there and knows their family h/x per patient. If any question please call patient back.

## 2021-01-02 NOTE — Telephone Encounter (Signed)
Copied from [place] [phone]. Topic: General - Other >> Jan 02, 2021 11:52 AM Robina Ade, Helene Kelp D wrote: Reason for CRM: Patient was seen in office and wants to know if Dr. Rosanna Randy can call him in cough medication please. Any questions please call patient back, thanks.

## 2021-01-03 MED ORDER — HYDROCODONE BIT-HOMATROP MBR 5-1.5 MG/5ML PO SOLN: 5.0000 mL | Freq: Three times a day (TID) | ORAL | 0 refills | Status: DC | PRN

## 2021-01-03 NOTE — Addendum Note (Signed)
Addended by: Eulas Post on: 01/03/2021 10:54 AM   Modules accepted: Orders

## 2021-01-05 NOTE — Telephone Encounter (Signed)
Patient called in to check status of Cardiology referral one was sent to [place] but patient want to see Dr Ryan Simmons in [place] and would like to get a call with some information please soon as possible.

## 2021-01-08 NOTE — Telephone Encounter (Signed)
Pt called and wants update on referral request, please advise

## 2021-01-11 ENCOUNTER — Other Ambulatory Visit: Payer: Self-pay | Admitting: *Deleted

## 2021-01-11 DIAGNOSIS — R002 Palpitations: Secondary | ICD-10-CM

## 2021-01-11 DIAGNOSIS — R0789 Other chest pain: Secondary | ICD-10-CM

## 2021-02-01 NOTE — Telephone Encounter (Signed)
Error

## 2021-02-05 NOTE — Progress Notes (Signed)
Cardiology Office Note:    Date:  02/08/2021   ID:  Ryan Simmons, DOB Feb 26, 1975, MRN [id number]  PCP:  Jerrol Banana., MD   [facility] Cardiologist:  Lenna Sciara, MD Referring MD: Jerrol Banana.,*   Chief Complaint/Reason for Referral:  Chest pain and palpitations  ASSESSMENT:    Chest pain of uncertain etiology  Palpitations  PLAN:    In order of problems listed above:  1.  We will obtain a coronary CTA and echocardiogram to evaluate further.  The patient has mild obstructive coronary artery disease he will require a statin (with goal LDL < 70) and aspirin, if he has high-grade disease we will need to consider optimal medical therapy and if symptoms are refractory to medical therapy, then a cardiac catheterization with possible PCI will be pursued to alleviate symptoms.  If he has high risk disease we will proceed directly to cardiac catheterization.  We will keep follow-up with me open-ended depending on the results of this testing.     2.  Discontinue aspirin now until he has proven atherosclerotic disease.  Unless he has obstructive coronary artery disease there is no indication for aspirin at this point in time.  We defer monitor for now unless it recurs; he has not had any palpitations in more than a month.   Dispo:  No follow-ups on file.     Medication Adjustments/Labs and Tests Ordered: Current medicines are reviewed at length with the patient today.  Concerns regarding medicines are outlined above.   Tests Ordered: No orders of the defined types were placed in this encounter.   Medication Changes: No orders of the defined types were placed in this encounter.   History of Present Illness:    The patient is a 46 y.o. male with the indicated medical history here for chest pain.  He had an episode of chest tightness in palpitations around Thanksgiving.  He has had none since then.  He cannot inform me about any associated factors  around this episode.  Since that time he has been working full-time and been fully active.  He has had no recurrent episodes of chest pain or shortness of breath.  He denies any significant bleeding or bruising.  He does have a history of ITP and has had a splenectomy.  He is required no hospitalizations or emergency room visits.  He is otherwise fairly well without significant complaints.     Previous Medical History: Past Medical History:  Diagnosis Date   Allergy    Anemia    Gout    Hyperlipemia    Palpitation    [place] spotted fever      Current Medications: Current Meds  Medication Sig   [DISCONTINUED] aspirin EC 81 MG tablet Take 81 mg by mouth daily. Swallow whole.     Allergies:    Patient has no known allergies.   Social History:   Social History   Tobacco Use   Smoking status: Never   Smokeless tobacco: Current    Types: Chew  Vaping Use   Vaping Use: Never used  Substance Use Topics   Alcohol use: Yes   Drug use: Never     Family Hx: Family History  Problem Relation Age of Onset   Gout Father    Heart disease Father        atrial fib   Healthy Mother    Healthy Brother      Review of Systems:  Please see the history of present illness.    All other systems reviewed and are negative.  EKGs/Labs/Other Test Reviewed:    EKG:  prior EKG: NSR  Prior CV studies: None available  Imaging studies that I have independently reviewed today: RUQ U/S  Recent Labs: No results found for requested labs within last 8760 hours.   Recent Lipid Panel Lab Results  Component Value Date/Time   CHOL 156 01/08/2017 08:43 AM   CHOL 198 07/02/2016 09:26 AM   TRIG 204 (H) 01/08/2017 08:43 AM   HDL 29 (L) 01/08/2017 08:43 AM   HDL 20 (L) 07/02/2016 09:26 AM   LDLCALC 97 01/08/2017 08:43 AM    Risk Assessment/Calculations:          Physical Exam:    VS:  BP 124/68    Pulse 81    Ht 6\' 1"  (1.854 m)    Wt 258 lb (117 kg)    SpO2 98%    BMI 34.04  kg/m    Wt Readings from Last 3 Encounters:  02/08/21 258 lb (117 kg)  01/01/21 256 lb (116.1 kg)  10/21/17 265 lb 6.4 oz (120.4 kg)    GENERAL:  No apparent distress, AOx3 HEENT:  No carotid bruits, +2 carotid impulses, no scleral icterus CAR: RRR  no murmurs, gallops, rubs, or thrills RES:  Clear to auscultation bilaterally ABD:  Soft, nontender, nondistended, positive bowel sounds x 4 VASC:  +2 radial pulses, +2 carotid pulses, palpable pedal pulses NEURO:  CN 2-12 grossly intact; motor and sensory grossly intact PSYCH:  No active depression or anxiety EXT:  No edema, ecchymosis, or cyanosis  Signed, Early Osmond, MD  02/08/2021 3:46 PM    [address], [place], [place]  [address] Phone: [phone]; Fax: [phone]   Note:  This document was prepared using Dragon voice recognition software and may include unintentional dictation errors.

## 2021-02-08 ENCOUNTER — Other Ambulatory Visit: Payer: Self-pay

## 2021-02-08 ENCOUNTER — Encounter: Payer: Self-pay | Admitting: Internal Medicine

## 2021-02-08 ENCOUNTER — Ambulatory Visit (INDEPENDENT_AMBULATORY_CARE_PROVIDER_SITE_OTHER): Payer: Self-pay | Admitting: Internal Medicine

## 2021-02-08 VITALS — BP 124/68 | HR 81 | Ht 73.0 in | Wt 258.0 lb

## 2021-02-08 DIAGNOSIS — R079 Chest pain, unspecified: Secondary | ICD-10-CM

## 2021-02-08 DIAGNOSIS — R002 Palpitations: Secondary | ICD-10-CM

## 2021-02-08 MED ORDER — METOPROLOL TARTRATE 100 MG PO TABS: 100.0000 mg | ORAL_TABLET | Freq: Once | ORAL | 0 refills | Status: DC

## 2021-02-08 NOTE — Patient Instructions (Addendum)
Medication Instructions:  STOP aspirin  *If you need a refill on your cardiac medications before your next appointment, please call your pharmacy*   Lab Work: None  If you have labs (blood work) drawn today and your tests are completely normal, you will receive your results only by: [place] (if you have MyChart) OR A paper copy in the mail If you have any lab test that is abnormal or we need to change your treatment, we will call you to review the results.   Testing/Procedures: Your physician has requested that you have an echocardiogram. Echocardiography is a painless test that uses sound waves to create images of your heart. It provides your doctor with information about the size and shape of your heart and how well your hearts chambers and valves are working. This procedure takes approximately one hour. There are no restrictions for this procedure.  Cardiac CTA see instructions below   Follow-Up: As needed   Other Instructions   Your cardiac CT will be scheduled at one of the below locations:   [facility] [address] [place], [place] [address] [phone]   If scheduled at [facility], please arrive at the [facility] main entrance (entrance A) of [facility] 30 minutes prior to test start time. You can use the FREE valet parking offered at the main entrance (encouraged to control the heart rate for the test) Proceed to the [facility] Radiology Department (first floor) to check-in and test prep.    Please follow these instructions carefully (unless otherwise directed):  Hold all erectile dysfunction medications at least 3 days (72 hrs) prior to test.  On the Night Before the Test: Be sure to Drink plenty of water. Do not consume any caffeinated/decaffeinated beverages or chocolate 12 hours prior to your test. Do not take any antihistamines 12 hours prior to your test.   On the Day of the Test: Drink plenty of water  until 1 hour prior to the test. Do not eat any food 4 hours prior to the test. You may take your regular medications prior to the test.  Take metoprolol (Lopressor) two hours prior to test. HOLD Furosemide/Hydrochlorothiazide morning of the test.        After the Test: Drink plenty of water. After receiving IV contrast, you may experience a mild flushed feeling. This is normal. On occasion, you may experience a mild rash up to 24 hours after the test. This is not dangerous. If this occurs, you can take Benadryl 25 mg and increase your fluid intake. If you experience trouble breathing, this can be serious. If it is severe call 911 IMMEDIATELY. If it is mild, please call our office. If you take any of these medications: Glipizide/Metformin, Avandament, Glucavance, please do not take 48 hours after completing test unless otherwise instructed.  Please allow 2-4 weeks for scheduling of routine cardiac CTs. Some insurance companies require a pre-authorization which may delay scheduling of this test.   For non-scheduling related questions, please contact the cardiac imaging nurse navigator should you have any questions/concerns: Marchia Bond, Cardiac Imaging Nurse Navigator Gordy Clement, Cardiac Imaging Nurse Navigator [facility] Direct Office Dial: [phone]   For scheduling needs, including cancellations and rescheduling, please call [place], [phone].

## 2021-02-26 ENCOUNTER — Ambulatory Visit (HOSPITAL_COMMUNITY): Payer: Self-pay

## 2021-03-12 ENCOUNTER — Other Ambulatory Visit: Payer: Self-pay

## 2021-03-12 ENCOUNTER — Ambulatory Visit (HOSPITAL_COMMUNITY): Payer: 59

## 2021-03-21 ENCOUNTER — Encounter (HOSPITAL_COMMUNITY): Payer: Self-pay

## 2021-03-22 ENCOUNTER — Ambulatory Visit (HOSPITAL_COMMUNITY)
Admission: RE | Admit: 2021-03-22 | Discharge: 2021-03-22 | Disposition: A | Discharge status: Home / Self Care | Payer: 59 | Source: Ambulatory Visit | Attending: Internal Medicine | Admitting: Internal Medicine

## 2021-03-22 ENCOUNTER — Other Ambulatory Visit: Payer: Self-pay

## 2021-03-22 DIAGNOSIS — R079 Chest pain, unspecified: Secondary | ICD-10-CM

## 2021-03-22 MED ORDER — IOHEXOL 350 MG/ML SOLN
100.0000 mL | Freq: Once | INTRAVENOUS | Status: AC | PRN
  Administered 2021-03-22: 100 mL via INTRAVENOUS

## 2021-03-22 MED ORDER — NITROGLYCERIN 0.4 MG SL SUBL
SUBLINGUAL_TABLET | SUBLINGUAL | Status: AC
  Filled 2021-03-22: qty 2

## 2021-03-22 MED ORDER — NITROGLYCERIN 0.4 MG SL SUBL
0.8000 mg | SUBLINGUAL_TABLET | Freq: Once | SUBLINGUAL | Status: AC
  Administered 2021-03-22: 0.8 mg via SUBLINGUAL

## 2021-03-23 ENCOUNTER — Ambulatory Visit (HOSPITAL_COMMUNITY): Payer: 59 | Attending: Internal Medicine

## 2021-03-23 DIAGNOSIS — R079 Chest pain, unspecified: Secondary | ICD-10-CM | POA: Insufficient documentation

## 2021-03-23 LAB — ECHOCARDIOGRAM COMPLETE
Area-P 1/2: 3.37 cm2
S' Lateral: 3.2 cm

## 2021-03-29 ENCOUNTER — Telehealth: Payer: Self-pay | Admitting: *Deleted

## 2021-03-29 DIAGNOSIS — E785 Hyperlipidemia, unspecified: Secondary | ICD-10-CM

## 2021-03-29 MED ORDER — ASPIRIN EC 81 MG PO TBEC: 81.0000 mg | DELAYED_RELEASE_TABLET | Freq: Every day | ORAL | 3 refills | Status: DC

## 2021-03-29 MED ORDER — ATORVASTATIN CALCIUM 40 MG PO TABS
40.0000 mg | ORAL_TABLET | Freq: Every day | ORAL | 3 refills | Status: DC
Start: 2021-03-29 — End: 2023-07-14

## 2021-03-29 NOTE — Telephone Encounter (Signed)
-----   Message from Early Osmond, MD sent at 03/22/2021  4:42 PM EST ----- Let him know he has only very mild blockages in the heart.  Restart ASA 81mg  and start atorvastatin 40; lipid panel and LFTs in 2 months.    He has some pulm nodules on CT scan, please transmit to PCP and have them follow up.  Thanks.

## 2022-05-28 IMAGING — CT CT HEART MORP W/ CTA COR W/ SCORE W/ CA W/CM &/OR W/O CM
1 series · 5 of 7 positions shown, 7 images · non-contrast
Comparison: 06/27/2011 chest radiograph.
COMPARISON: 06/27/2011 chest radiograph.

Addendum:
EXAM:
OVER-READ INTERPRETATION  CT CHEST

The following report is an over-read performed by radiologist Dr.
Is Mhe Temperature [REDACTED] on 03/22/2021. This over-read
does not include interpretation of cardiac or coronary anatomy or
pathology. The coronary CTA interpretation by the cardiologist is
attached.
CLINICAL DATA: 45 Year old White Male
Cardiac/Coronary  CTA
TECHNIQUE: The patient was scanned on a Phillips Force scanner.

[Series 2136: — · 5 of 7 slices shown, 7 images]
[im 2/7  vessel]
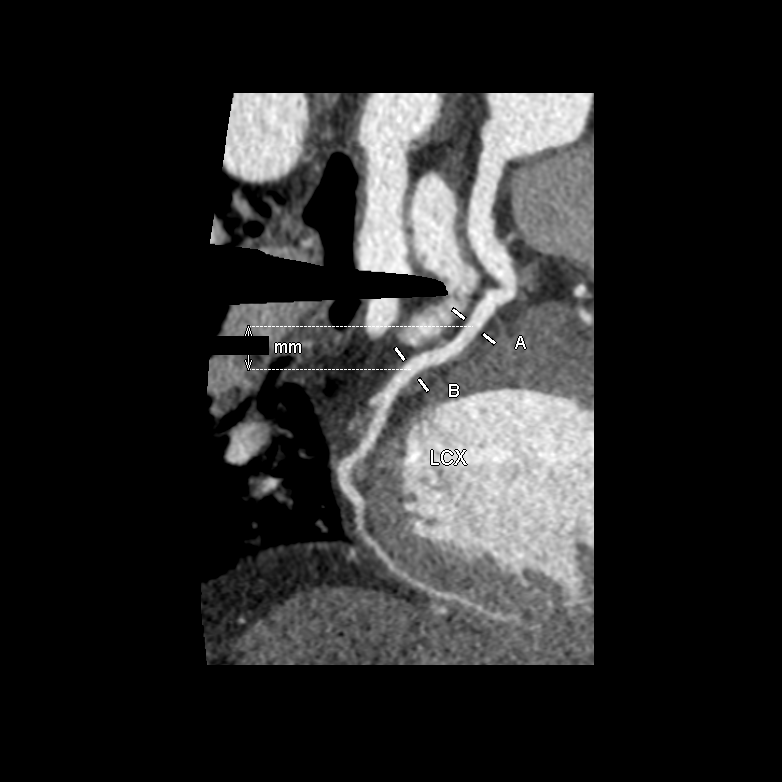
[im 2/7  lung]
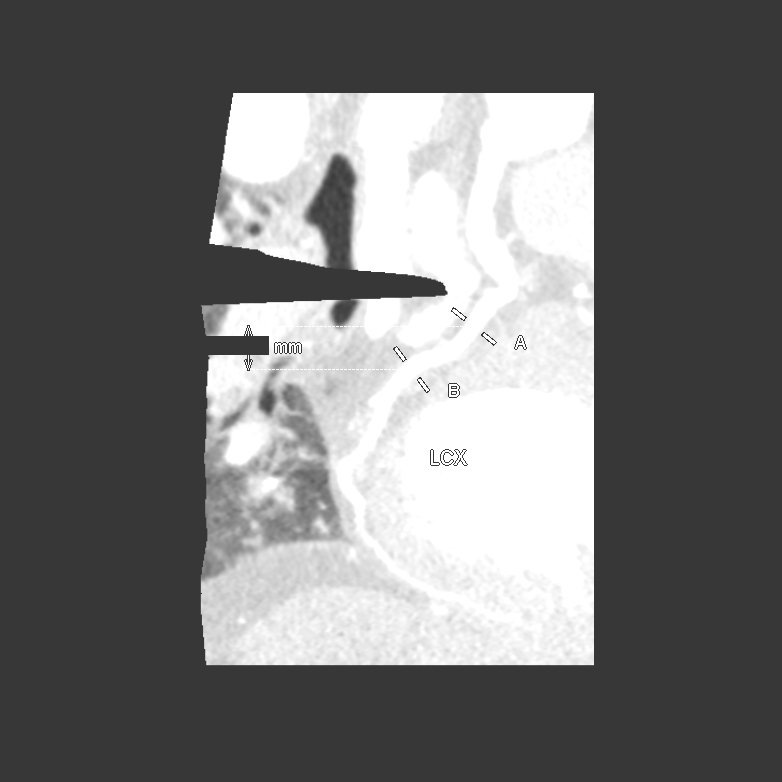
[im 3/7  vessel]
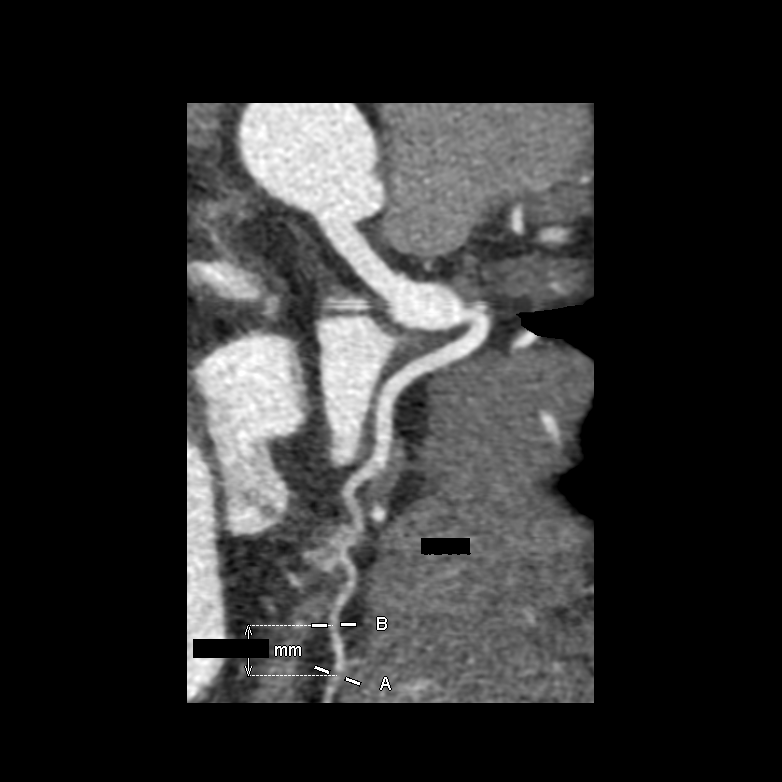
[im 4/7  vessel]
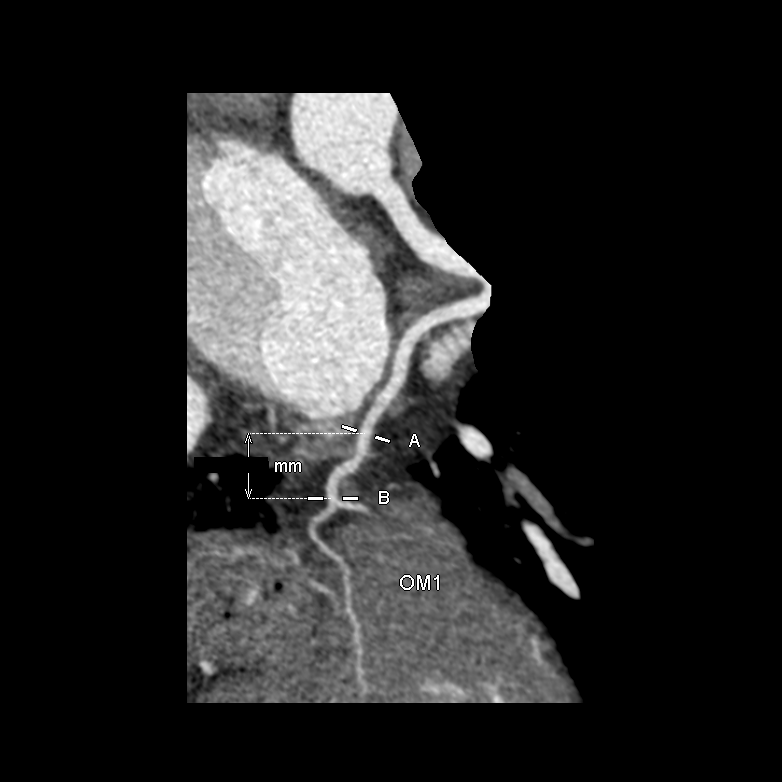
[im 5/7  vessel]
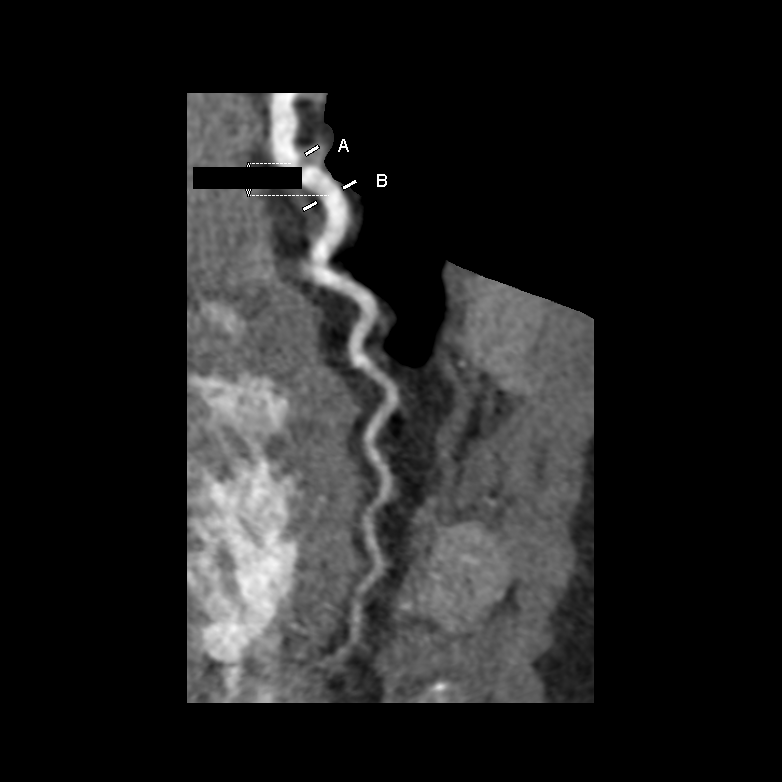
[im 6/7  vessel]
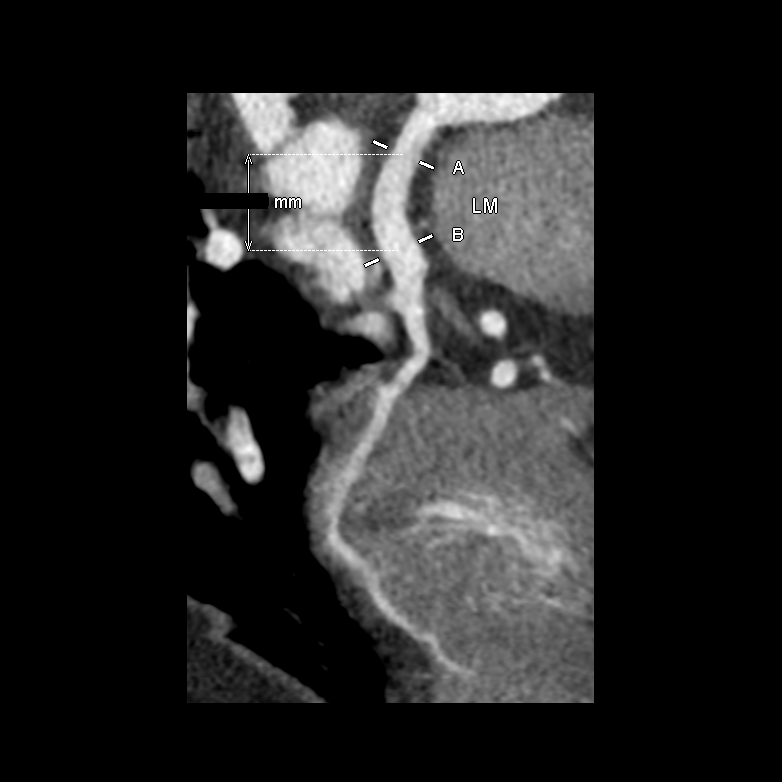
[im 6/7  lung]
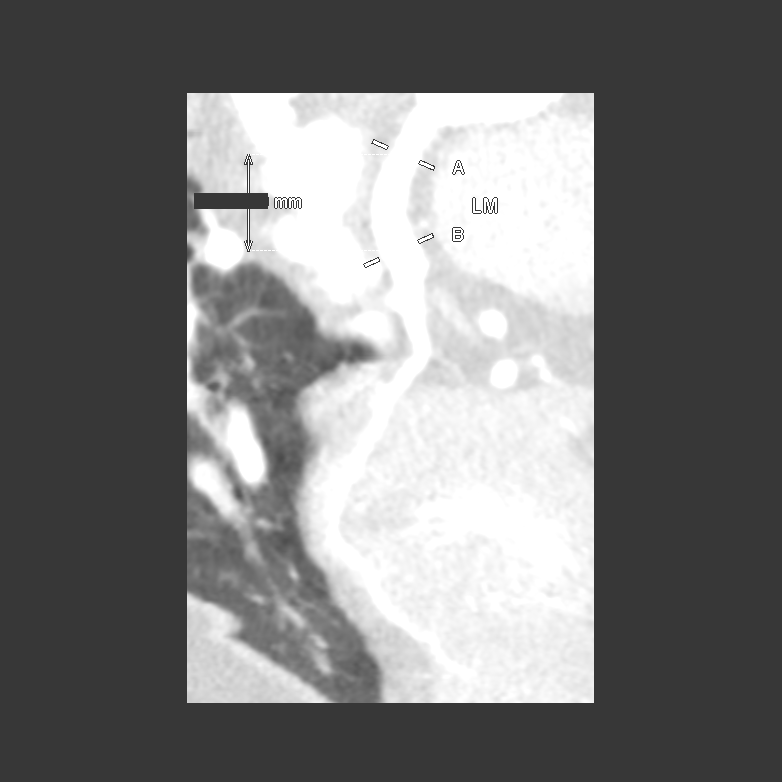

[5 of 7 positions shown; findings below may reference images not displayed]

FINDINGS: Vascular: Normal aortic caliber.

Mediastinum/Nodes: No imaged thoracic adenopathy. An 8 mm left
infrahilar node on [DATE].

Lungs/Pleura: No pleural fluid. Low lung volumes with mild left
hemidiaphragm elevation. A left lower lobe pulmonary nodule measures
1.3 cm on [DATE] and coronal image 98.

Upper Abdomen: Normal imaged portions of the liver, stomach. Prior
splenectomy.

Musculoskeletal: No acute osseous abnormality. Moderate thoracic
spondylosis.
IMPRESSION: 1. Left lower lobe 13 mm pulmonary nodule. Consider
multidisciplinary thoracic oncology consultation for eventual PET.
2. Prominent but not pathologically sized left infrahilar node of 8
mm. Recommend attention on follow-up.

These results will be called to the ordering clinician or
representative by the Radiologist Assistant, and communication
documented in the PACS or [REDACTED].
FINDINGS: Scan was triggered in the descending thoracic aorta. Axial
non-contrast 3 mm slices were carried out through the heart. The
data set was analyzed on a dedicated work station and scored using
the Agatson method. Gantry rotation speed was 250 msecs and
collimation was .6 mm. 0.8 mg of sl NTG was given. The 3D data set
was reconstructed in 5% intervals of the 67-82 % of the R-R cycle.
Diastolic phases were analyzed on a dedicated work station using
MPR, MIP and VRT modes. The patient received 100 cc of contrast.

Aorta:  Normal size.  No calcifications.  No dissection.

Main Pulmonary Artery: No well visualized in this scan.

Aortic Valve:  Tri-leaflet.  No calcifications.

Coronary Arteries:  Normal coronary origin.  Right dominance.

Coronary Calcium Score:

Left main: 0

Left anterior descending artery: 5

Left circumflex artery: 0

Right coronary artery: 0

Total: 5

Percentile: 79th for age, sex, and race matched control.

RCA is a large dominant artery that gives rise to PDA and PLA. There
is no significant plaque.

Left main is a large artery that gives rise to LAD and LCX arteries.
There is no significant plaque.

LAD is a large vessel that gives rise to one large D1 Branch. There
is no significant plaque. The proximal LAD is not well visualized in
any phase due to motion artifact, there is a minimal calcified
plaque that is seen in calcium scoring.

LCX is a non-dominant artery that gives rise to one large OM1 branch
that bifurcates. There is no significant plaque.

Other findings:

Normal pulmonary vein drainage into the left atrium.

Normal left atrial appendage without a thrombus.

Extra-cardiac findings: See attached radiology report for
non-cardiac structures.

Notable cardiac motion artifact.
IMPRESSION: 1. Coronary calcium score of 5. This was 79th percentile for age,
sex, and race matched control.

2. Normal coronary origin with right dominance.

3. CAD-RADS 1. Minimal non-obstructive CAD (1-24%). Consider
non-atherosclerotic causes of chest pain. Consider preventive
therapy and risk factor modification.

4. Motion artifact decrease specificity for findings in the proximal
LAD.

RECOMMENDATIONS:



If CAC = 0, it is reasonable to withhold statin therapy and reassess
in 5 to 10 years, as long as higher risk conditions are absent
(diabetes mellitus, family history of premature CHD in first degree
relatives (males <55 years; females <65 years), cigarette smoking,
LDL >=190 mg/dL or other independent risk factors).

If CAC is 1 to 99, it is reasonable to initiate statin therapy for
patients >=55 years of age.

If CAC is >=100 or >=75th percentile, it is reasonable to initiate
statin therapy at any age.

Cardiology referral should be considered for patients with CAC
scores =400 or >=75th percentile.

*0433 AHA/ACC/AACVPR/AAPA/ABC/BAARDT/NACK/OMEISHA/Jumper/DFG/TIGER/HOME
Guideline on the Management of Blood Cholesterol: A Report of the
American College of Cardiology/American Heart Association Task Force
on Clinical Practice Guidelines. J Am Coll Cardiol.
2365;73(24):0222-0769.

*** End of Addendum ***
EXAM:
OVER-READ INTERPRETATION  CT CHEST

The following report is an over-read performed by radiologist Dr.
Is Mhe Temperature [REDACTED] on 03/22/2021. This over-read
does not include interpretation of cardiac or coronary anatomy or
pathology. The coronary CTA interpretation by the cardiologist is
attached.
FINDINGS: Vascular: Normal aortic caliber.

Mediastinum/Nodes: No imaged thoracic adenopathy. An 8 mm left
infrahilar node on [DATE].

Lungs/Pleura: No pleural fluid. Low lung volumes with mild left
hemidiaphragm elevation. A left lower lobe pulmonary nodule measures
1.3 cm on [DATE] and coronal image 98.

Upper Abdomen: Normal imaged portions of the liver, stomach. Prior
splenectomy.

Musculoskeletal: No acute osseous abnormality. Moderate thoracic
spondylosis.
IMPRESSION: 1. Left lower lobe 13 mm pulmonary nodule. Consider
multidisciplinary thoracic oncology consultation for eventual PET.
2. Prominent but not pathologically sized left infrahilar node of 8
mm. Recommend attention on follow-up.

These results will be called to the ordering clinician or
representative by the Radiologist Assistant, and communication
documented in the PACS or [REDACTED].

## 2022-06-14 ENCOUNTER — Other Ambulatory Visit: Payer: Self-pay

## 2022-06-14 DIAGNOSIS — R911 Solitary pulmonary nodule: Secondary | ICD-10-CM

## 2022-06-14 DIAGNOSIS — Z9081 Acquired absence of spleen: Secondary | ICD-10-CM

## 2022-06-26 ENCOUNTER — Ambulatory Visit
Admission: RE | Admit: 2022-06-26 | Discharge: 2022-06-26 | Disposition: A | Discharge status: Home / Self Care | Payer: BC Managed Care – PPO | Source: Ambulatory Visit | Attending: Infectious Diseases | Admitting: Infectious Diseases

## 2022-06-26 DIAGNOSIS — R911 Solitary pulmonary nodule: Secondary | ICD-10-CM | POA: Insufficient documentation

## 2022-06-26 DIAGNOSIS — Z9081 Acquired absence of spleen: Secondary | ICD-10-CM | POA: Insufficient documentation

## 2022-07-05 ENCOUNTER — Inpatient Hospital Stay: Payer: BC Managed Care – PPO

## 2022-07-05 ENCOUNTER — Encounter: Payer: Self-pay | Admitting: Internal Medicine

## 2022-07-05 ENCOUNTER — Inpatient Hospital Stay: Payer: BC Managed Care – PPO | Attending: Internal Medicine | Admitting: Internal Medicine

## 2022-07-05 ENCOUNTER — Encounter: Payer: Self-pay | Admitting: *Deleted

## 2022-07-05 VITALS — BP 129/85 | HR 80 | Temp 97.2°F | Ht 73.0 in | Wt 265.4 lb

## 2022-07-05 DIAGNOSIS — Z9081 Acquired absence of spleen: Secondary | ICD-10-CM | POA: Diagnosis not present

## 2022-07-05 DIAGNOSIS — R918 Other nonspecific abnormal finding of lung field: Secondary | ICD-10-CM | POA: Diagnosis present

## 2022-07-05 NOTE — Progress Notes (Signed)
Met with patient during initial consult with Dr. Donneta Romberg. All questions answered. Reassurance provided. Reviewed upcoming appts. Referral to Dr. Karna Christmas faxed to his clinic and they will contact Ryan Simmons with appt. Contact info given and instructed to call with any questions or needs. Ryan Simmons verbalized understanding.

## 2022-07-05 NOTE — Assessment & Plan Note (Addendum)
#    MAY 2024 CT chest [compared to CT FEB 2024]- Left infrahilar lymph node is progressive in the interval measuring 12 mm short axis today compared to 8 mm previously. 13 mm left lower lobe pulmonary nodule is stable.   # Given apparent progression of the left infrahilar node, consider PET-CT to assess for hypermetabolism in the left lower lobe nodule and hilar lymph node.  Recommend PET scan ASAP.  This ordered today.   # Discussed regarding the need for bronchoscopy and biopsy for further characterization.  Discussed with Dr.Aleskerov.   # Discussed that given more than a year timeline between the 2 scans-it is quite possible the patient might have had a low-grade malignancy versus benign lesion.  Again await above workup/biopsy.   # Hx of ITP s/p splenecomy- mild thrombocytopenia? Vaccinations- [hx of meningitis]  Thank you Dr. Sampson Goon for allowing me to participate in the care of your pleasant patient. Please do not hesitate to contact me with questions or concerns in the interim.  # DISPOSITION: # referral to Dr.Aleskerov re: lung nodule # PET scan ASAP # no labs today # follow up TBD- Dr.B  # I reviewed the blood work- with the patient in detail; also reviewed the imaging independently [as summarized above]; and with the patient in detail.

## 2022-07-05 NOTE — Progress Notes (Signed)
[facility] CONSULT NOTE  Patient Care Team: Mick Sell, MD as PCP - General (Internal Medicine) Pasty Spillers, MD (Inactive) as Consulting Physician (Gastroenterology) Glory Buff, RN as Oncology Nurse Navigator  CHIEF COMPLAINTS/PURPOSE OF CONSULTATION: lung nodule/mass  #  Oncology History   No history exists.     HISTORY OF PRESENTING ILLNESS: Patient ambulating-independently.   Alone/Accompanied by wife.   Ryan Simmons 47 y.o.  male NO history of smoking is here for further evaluation and recommendations for lung mass/nodule.  Patient noted to have a lung nodule in the left lower lobe/along with 8 mm left hilar lung nodule incidentally on imaging of the coronaries in February 2023.  At that time cardiac workup was negative for any acute process.  Otherwise the patient denies any shortness of breath or cough.  Denies any fevers or chills.  No chest pain.   Review of Systems  Constitutional:  Negative for chills, diaphoresis, fever, malaise/fatigue and weight loss.  HENT:  Negative for nosebleeds and sore throat.   Eyes:  Negative for double vision.  Respiratory:  Negative for cough, hemoptysis, sputum production, shortness of breath and wheezing.   Cardiovascular:  Negative for chest pain, palpitations, orthopnea and leg swelling.  Gastrointestinal:  Negative for abdominal pain, blood in stool, constipation, diarrhea, heartburn, melena, nausea and vomiting.  Genitourinary:  Negative for dysuria, frequency and urgency.  Musculoskeletal:  Negative for back pain and joint pain.  Skin: Negative.  Negative for itching and rash.  Neurological:  Negative for dizziness, tingling, focal weakness, weakness and headaches.  Endo/Heme/Allergies:  Does not bruise/bleed easily.  Psychiatric/Behavioral:  Negative for depression. The patient is not nervous/anxious and does not have insomnia.      MEDICAL HISTORY:  Past Medical History:  Diagnosis Date    Allergy    Anemia    Gout    Hyperlipemia    Palpitation    [place] spotted fever     SURGICAL HISTORY: Past Surgical History:  Procedure Laterality Date   COLONOSCOPY WITH PROPOFOL N/A 07/25/2017   Procedure: COLONOSCOPY WITH PROPOFOL;  Surgeon: Pasty Spillers, MD;  Location: [facility] ENDOSCOPY;  Service: Endoscopy;  Laterality: N/A;   ESOPHAGOGASTRODUODENOSCOPY (EGD) WITH PROPOFOL N/A 07/25/2017   Procedure: ESOPHAGOGASTRODUODENOSCOPY (EGD) WITH PROPOFOL;  Surgeon: Pasty Spillers, MD;  Location: [facility] ENDOSCOPY;  Service: Endoscopy;  Laterality: N/A;   KNEE SURGERY Left    SHOULDER SURGERY Right    SPLENECTOMY      SOCIAL HISTORY: Social History   Socioeconomic History   Marital status: Married    Spouse name: Not on file   Number of children: 0   Years of education: 16   Highest education level: Bachelor's degree (e.g., BA, AB, BS)  Occupational History    Employer: [employer]  Tobacco Use   Smoking status: Never   Smokeless tobacco: Current    Types: Chew  Vaping Use   Vaping Use: Never used  Substance and Sexual Activity   Alcohol use: Yes   Drug use: Never   Sexual activity: Yes    Partners: Female  Other Topics Concern   Not on file  Social History Narrative   Lives in [place]; in [place]; lives with wife. Never smoked; ocassional alcohol.    Social Determinants of Health   Financial Resource Strain: Not on file  Food Insecurity: No Food Insecurity (07/05/2022)   Hunger Vital Sign    Worried About Running Out of Food in the Last Year:  Never true    Ran Out of Food in the Last Year: Never true  Transportation Needs: No Transportation Needs (07/05/2022)   PRAPARE - Administrator, Civil Service (Medical): No    Lack of Transportation (Non-Medical): No  Physical Activity: Not on file  Stress: Not on file  Social Connections: Not on file  Intimate Partner Violence: Not At Risk (07/05/2022)   Humiliation, Afraid,  Rape, and Kick questionnaire    Fear of Current or Ex-Partner: No    Emotionally Abused: No    Physically Abused: No    Sexually Abused: No    FAMILY HISTORY: Family History  Problem Relation Age of Onset   Gout Father    Heart disease Father        atrial fib   Healthy Mother    Healthy Brother     ALLERGIES:  has No Known Allergies.  MEDICATIONS:  Current Outpatient Medications  Medication Sig Dispense Refill   atorvastatin (LIPITOR) 40 MG tablet Take 1 tablet (40 mg total) by mouth daily. 90 tablet 3   aspirin EC 81 MG tablet Take 1 tablet (81 mg total) by mouth daily. Swallow whole. 90 tablet 3   metoprolol tartrate (LOPRESSOR) 100 MG tablet Take 1 tablet (100 mg total) by mouth once for 1 dose. Take 90-120 minutes prior to scan. 1 tablet 0   No current facility-administered medications for this visit.      Marland Kitchen  PHYSICAL EXAMINATION: ECOG PERFORMANCE STATUS: 0 - Asymptomatic  Vitals:   07/05/22 0859  BP: 129/85  Pulse: 80  Temp: (!) 97.2 F (36.2 C)   Filed Weights   07/05/22 0859  Weight: 265 lb 6.4 oz (120.4 kg)    Physical Exam Vitals and nursing note reviewed.  HENT:     Head: Normocephalic and atraumatic.     Mouth/Throat:     Pharynx: Oropharynx is clear.  Eyes:     Extraocular Movements: Extraocular movements intact.     Pupils: Pupils are equal, round, and reactive to light.  Cardiovascular:     Rate and Rhythm: Normal rate and regular rhythm.  Pulmonary:     Comments: Decreased breath sounds bilaterally.  Abdominal:     Palpations: Abdomen is soft.  Musculoskeletal:        General: Normal range of motion.     Cervical back: Normal range of motion.  Skin:    General: Skin is warm.  Neurological:     General: No focal deficit present.     Mental Status: He is alert and oriented to person, place, and time.  Psychiatric:        Behavior: Behavior normal.        Judgment: Judgment normal.     LABORATORY DATA:  I have reviewed the data  as listed Lab Results  Component Value Date   WBC 9.2 07/18/2017   HGB 16.4 07/18/2017   HCT 47.0 07/18/2017   MCV 85.1 07/18/2017   PLT 142 (L) 07/18/2017   No results for input(s): "NA", "K", "CL", "CO2", "GLUCOSE", "BUN", "CREATININE", "CALCIUM", "GFRNONAA", "GFRAA", "PROT", "ALBUMIN", "AST", "ALT", "ALKPHOS", "BILITOT", "BILIDIR", "IBILI" in the last 8760 hours.  RADIOGRAPHIC STUDIES: I have personally reviewed the radiological images as listed and agreed with the findings in the report. CT CHEST WO CONTRAST  Result Date: 06/30/2022 CLINICAL DATA:  Pulmonary nodule on previous imaging. EXAM: CT CHEST WITHOUT CONTRAST TECHNIQUE: Multidetector CT imaging of the chest was performed following the standard protocol without IV  contrast. RADIATION DOSE REDUCTION: This exam was performed according to the departmental dose-optimization program which includes automated exposure control, adjustment of the mA and/or kV according to patient size and/or use of iterative reconstruction technique. COMPARISON:  CT heart study 03/22/2021 FINDINGS: Cardiovascular: The heart size is normal. No substantial pericardial effusion. No thoracic aortic aneurysm. No substantial atherosclerosis of the thoracic aorta. Mediastinum/Nodes: No mediastinal lymphadenopathy. 8 mm left infrahilar node measured previously is 12 mm short axis today on image 59/2. The esophagus has normal imaging features. There is no axillary lymphadenopathy. Lungs/Pleura: 13 mm left lower lobe pulmonary nodule is stable in the 3 month interval since prior study. 5 mm right lower lobe perifissural nodule on 63/3 is unchanged and compatible with subpleural lymph node. No additional suspicious pulmonary nodule or mass. No focal airspace consolidation. There is no evidence of pleural effusion. Upper Abdomen: Sequelae of prior splenectomy. Visualized upper abdomen otherwise unremarkable. Musculoskeletal: No worrisome lytic or sclerotic osseous abnormality.  IMPRESSION: 1. Left infrahilar lymph node is progressive in the interval measuring 12 mm short axis today compared to 8 mm previously. 13 mm left lower lobe pulmonary nodule is stable in the 3 month interval since prior study. Given apparent progression of the left infrahilar node, consider PET-CT to assess for hypermetabolism in the left lower lobe nodule and hilar lymph node. Electronically Signed   By: Kennith Center M.D.   On: 06/30/2022 09:48    ASSESSMENT & PLAN:   Mass of lower lobe of left lung #  MAY 2024 CT chest [compared to CT FEB 2024]- Left infrahilar lymph node is progressive in the interval measuring 12 mm short axis today compared to 8 mm previously. 13 mm left lower lobe pulmonary nodule is stable.   # Given apparent progression of the left infrahilar node, consider PET-CT to assess for hypermetabolism in the left lower lobe nodule and hilar lymph node.  Recommend PET scan ASAP.  This ordered today.   # Discussed regarding the need for bronchoscopy and biopsy for further characterization.  Discussed with Dr.Aleskerov.   # Discussed that given more than a year timeline between the 2 scans-it is quite possible the patient might have had a low-grade malignancy versus benign lesion.  Again await above workup/biopsy.   # Hx of ITP s/p splenecomy- mild thrombocytopenia? Vaccinations- [hx of meningitis]  Thank you Dr. Sampson Goon for allowing me to participate in the care of your pleasant patient. Please do not hesitate to contact me with questions or concerns in the interim.  # DISPOSITION: # referral to Dr.Aleskerov re: lung nodule # PET scan ASAP # no labs today # follow up TBD- Dr.B  # I reviewed the blood work- with the patient in detail; also reviewed the imaging independently [as summarized above]; and with the patient in detail.    All questions were answered. The patient knows to call the clinic with any problems, questions or concerns.    Earna Coder,  MD 07/05/2022 4:27 PM

## 2022-07-09 ENCOUNTER — Telehealth: Payer: Self-pay | Admitting: Internal Medicine

## 2022-07-09 ENCOUNTER — Ambulatory Visit
Admission: RE | Admit: 2022-07-09 | Discharge: 2022-07-09 | Disposition: A | Discharge status: Home / Self Care | Payer: BC Managed Care – PPO | Source: Ambulatory Visit | Attending: Internal Medicine | Admitting: Internal Medicine

## 2022-07-09 ENCOUNTER — Telehealth: Payer: Self-pay | Admitting: *Deleted

## 2022-07-09 DIAGNOSIS — R918 Other nonspecific abnormal finding of lung field: Secondary | ICD-10-CM | POA: Diagnosis present

## 2022-07-09 LAB — GLUCOSE, CAPILLARY: Glucose-Capillary: 105 mg/dL — ABNORMAL HIGH (ref 70–99)

## 2022-07-09 MED ORDER — FLUDEOXYGLUCOSE F - 18 (FDG) INJECTION
12.0000 | Freq: Once | INTRAVENOUS | Status: AC
  Administered 2022-07-09: 12.6 via INTRAVENOUS

## 2022-07-09 NOTE — Telephone Encounter (Signed)
I spoke to patient regarding results of the PET scan  # Lung nodule-possibly benign.  Will discuss at tumor conference.  # Tonsillar enlargement-will discuss with Dr. Andee Poles.  # Question sigmoid diverticulitis-patient asymptomatic.  Hold off any antibiotics.  Will discuss with GI previously seen Dr.Tahiliani.   GB

## 2022-07-09 NOTE — Telephone Encounter (Signed)
Called report  IMPRESSION: 1. No significant metabolic activity associated with the LEFT lower lobe pulmonary nodule and LEFT hilar lymph nodes. Favor benign etiology. 2. Intense symmetric metabolic activity in the lingual and palatine tonsils with associated mucosal thickening. Recommend clinical correlation and potential direct visualization 2 differentiated from benign lymphoid tissue versus lymphoma versus infectious or inflammatory process. 3. Single hypermetabolic LEFT level 2 lymph node. Favor related to lymphoid process described in impression 2. 4. Inflammation in the peritoneal fat adjacent to the descending colon. Findings most consistent with mild ACUTE DIVERTICULITIS.     Electronically Signed   By: Genevive Bi M.D.   On: 07/09/2022 11:09

## 2022-07-10 ENCOUNTER — Telehealth: Payer: Self-pay

## 2022-07-10 NOTE — Telephone Encounter (Signed)
Called patient and let him know that we were contacted by Dr. Donneta Romberg for [place] to schedule him an appointment with [place]. Patient agreed and he will come in on 07/18/2022 with Mrs. Celso Amy.

## 2022-07-10 NOTE — Telephone Encounter (Signed)
-----   Message from Wyline Mood, MD sent at 07/10/2022  9:00 AM EDT ----- Regarding: RE: pt of dr.tahiliani/ needs GI evaluation Kandis Cocking will need office visit with any available provider - non urgent - can see Inetta Fermo too    ----- Message ----- From: Earna Coder, MD Sent: 07/09/2022   4:20 PM EDT To: Zeb Comfort, RN; Wyline Mood, MD Subject: pt of dr.tahiliani/ needs GI evaluation        Hi Kiran-appreciate if you can help me out with this patient regarding GI follow-up.  Seen your previous partner in the past-had a colonoscopy 2019.  Patient had an incidental finding of "acute diverticulitis" based on imaging-but asymptomatic.  I did not start any antibiotics for him.    However I would appreciate if your office can reach out to him regarding appointment/follow-up. Thanks GB

## 2022-07-18 ENCOUNTER — Other Ambulatory Visit: Payer: BC Managed Care – PPO

## 2022-07-18 ENCOUNTER — Encounter: Payer: Self-pay | Admitting: *Deleted

## 2022-07-18 ENCOUNTER — Ambulatory Visit: Payer: BC Managed Care – PPO | Admitting: Physician Assistant

## 2022-07-18 DIAGNOSIS — R918 Other nonspecific abnormal finding of lung field: Secondary | ICD-10-CM

## 2022-07-18 NOTE — Progress Notes (Signed)
Called pt to review recommendations from case conference. Pt did not answer. Message left for pt to call back to further review.

## 2022-07-19 NOTE — Progress Notes (Signed)
Attempted to call pt and wife today with no answer. Message with pt's wife, Baxter Hire, to call back to further discuss PET scan and recommendations.

## 2022-07-22 ENCOUNTER — Encounter: Payer: Self-pay | Admitting: *Deleted

## 2022-07-22 NOTE — Addendum Note (Signed)
Addended by: Glory Buff on: 07/22/2022 02:14 PM   Modules accepted: Orders

## 2022-07-22 NOTE — Progress Notes (Signed)
Spoke with patient on the phone and reviewed recommendations from case conference. Pt in agreement with pulmonary referral at this time. Informed that he does not need any further follow up at this time with Dr. B but may call if has any further questions or needs. Pt verbalized understanding. Referral to Dr. Jayme Cloud or Dr. Aundria Rud placed at this time.

## 2022-08-12 ENCOUNTER — Other Ambulatory Visit: Payer: Self-pay

## 2022-08-13 ENCOUNTER — Ambulatory Visit (INDEPENDENT_AMBULATORY_CARE_PROVIDER_SITE_OTHER): Payer: BC Managed Care – PPO | Admitting: Pulmonary Disease

## 2022-08-13 ENCOUNTER — Encounter: Payer: Self-pay | Admitting: Pulmonary Disease

## 2022-08-13 VITALS — BP 110/76 | HR 77 | Temp 98.3°F | Ht 73.0 in | Wt 271.4 lb

## 2022-08-13 DIAGNOSIS — R911 Solitary pulmonary nodule: Secondary | ICD-10-CM

## 2022-08-13 NOTE — Progress Notes (Signed)
Subjective:    Patient ID: Ryan Simmons, male    DOB: 02-12-1975, 47 y.o.   MRN: [id number]  Patient Care Team: Mick Sell, MD as PCP - General (Internal Medicine) Pasty Spillers, MD (Inactive) as Consulting Physician (Gastroenterology) Glory Buff, RN as Oncology Nurse Navigator  Chief Complaint  Patient presents with  . Consult    Lung nodule.    HPI    Review of Systems A 10 point review of systems was performed and it is as noted above otherwise negative.   Past Medical History:  Diagnosis Date  . Allergy   . Anemia   . Gout   . Hyperlipemia   . Palpitation   . Rocky Mountain spotted fever     Past Surgical History:  Procedure Laterality Date  . COLONOSCOPY WITH PROPOFOL N/A 07/25/2017   Procedure: COLONOSCOPY WITH PROPOFOL;  Surgeon: Pasty Spillers, MD;  Location: [facility] ENDOSCOPY;  Service: Endoscopy;  Laterality: N/A;  . ESOPHAGOGASTRODUODENOSCOPY (EGD) WITH PROPOFOL N/A 07/25/2017   Procedure: ESOPHAGOGASTRODUODENOSCOPY (EGD) WITH PROPOFOL;  Surgeon: Pasty Spillers, MD;  Location: [facility] ENDOSCOPY;  Service: Endoscopy;  Laterality: N/A;  . KNEE SURGERY Left   . SHOULDER SURGERY Right   . SPLENECTOMY      Patient Active Problem List   Diagnosis Date Noted  . Mass of lower lobe of left lung 07/05/2022  . Intestinal lump   . Polyp of sigmoid colon   . Internal hemorrhoids   . Hematochezia   . Diverticulosis of large intestine without diverticulitis   . Ectopic gastric mucosa   . Stomach irritation   . Cirrhosis of liver without ascites (HCC)   . Absolute anemia 10/27/2014  . Cough, persistent 10/27/2014  . Gout 10/27/2014  . H/O splenectomy 10/27/2014  . Leukocytosis 10/27/2014  . Shingles 10/27/2014  . Immune thrombocytopenic purpura (HCC) 08/14/2009  . HLD (hyperlipidemia) 08/01/2009  . Current tobacco use 03/29/2008    Family History  Problem Relation Age of Onset  . Gout Father   . Heart disease Father         atrial fib  . Healthy Mother   . Healthy Brother     Social History   Tobacco Use  . Smoking status: Never  . Smokeless tobacco: Current    Types: Chew  Substance Use Topics  . Alcohol use: Yes    No Known Allergies  Current Meds  Medication Sig  . rosuvastatin (CRESTOR) 5 MG tablet Take 5 mg by mouth daily.    Immunization History  Administered Date(s) Administered  . DTaP 07/15/1975, 01/02/1976, 07/20/1976, 01/23/1977  . HIB (PRP-T) 05/23/2016  . Hep A, Unspecified 12/05/2014  . Hep B, Unspecified 12/05/2014  . Hepatitis A 12/05/2014  . Hepatitis A, Adult 06/07/2015  . Hepatitis B 12/05/2014  . Hepatitis B, ADULT 01/05/2015, 06/07/2015  . IPV 07/15/1975, 11/01/1975, 01/23/1977, 09/06/1980  . Influenza,inj,Quad PF,6+ Mos 10/27/2014, 12/10/2016  . Influenza,inj,quad, With Preservative 02/02/2018  . Influenza-Unspecified 02/02/2018  . MMR 10/02/1976, 07/11/2015  . Meningococcal B, OMV 10/27/2014, 12/05/2014  . Meningococcal Conjugate 07/12/2009, 10/27/2014  . Pneumococcal Conjugate-13 10/27/2014  . Pneumococcal Polysaccharide-23 02/18/2005, 01/05/2015  . Td 09/06/1980  . Tdap 08/29/2011        Objective:     BP 110/76 (BP Location: Left Arm, Cuff Size: Large)   Pulse 77   Temp 98.3 F (36.8 C)   Ht 6\' 1"  (1.854 m)   Wt 271 lb 6.4 oz (123.1 kg)   SpO2 97%  BMI 35.81 kg/m   SpO2: 97 % O2 Device: None (Room air)  GENERAL: HEAD: Normocephalic, atraumatic.  EYES: Pupils equal, round, reactive to light.  No scleral icterus.  MOUTH:  NECK: Supple. No thyromegaly. Trachea midline. No JVD.  No adenopathy. PULMONARY: Good air entry bilaterally.  No adventitious sounds. CARDIOVASCULAR: S1 and S2. Regular rate and rhythm.  ABDOMEN: MUSCULOSKELETAL: No joint deformity, no clubbing, no edema.  NEUROLOGIC:  SKIN: Intact,warm,dry. PSYCH:       SPN malignancy risk score risk (Mayo):1.0%  Assessment & Plan:   No diagnosis found.  No orders of the  defined types were placed in this encounter.   No orders of the defined types were placed in this encounter.       Gailen Shelter, MD Advanced Bronchoscopy PCCM [facility]    *This note was dictated using voice recognition software/Dragon.  Despite best efforts to proofread, errors can occur which can change the meaning. Any transcriptional errors that result from this process are unintentional and may not be fully corrected at the time of dictation.

## 2022-08-13 NOTE — Patient Instructions (Signed)
We are going to performed a blood test that will let us know about the probability of cancer in this nodule.  My impression is that this is likely a benign (not cancer) process.  However, it is best to be certain.  We will call you with the results of the test.  This usually takes 1 to 2 weeks.  We will see you in follow-up in 3 months time call sooner should any new problems arise.

## 2022-08-14 ENCOUNTER — Encounter: Payer: Self-pay | Admitting: Pulmonary Disease

## 2022-08-14 ENCOUNTER — Ambulatory Visit: Payer: BC Managed Care – PPO | Admitting: Physician Assistant

## 2022-08-20 ENCOUNTER — Ambulatory Visit: Payer: BC Managed Care – PPO | Admitting: Gastroenterology

## 2022-08-20 NOTE — Progress Notes (Deleted)
Wyline Mood MD, MRCP(U.K) [address]  [address]  [place], [place] [address]  Main: [phone]  Fax: [phone]   Gastroenterology Consultation  Referring Provider:    Dr. Donneta Romberg Primary Care Physician:  Mick Sell, MD Primary Gastroenterologist:  Dr. Wyline Mood  Reason for Consultation:    Diverticulitis        HPI:   KAPENA HAMME is a 47 y.o. y/o male referred for diverticulitis by Dr. Donneta Romberg.   07/09/2022 PET scan showed inflammation in the peritoneal fat adjacent to the descending colon suggestive of acute diverticulitis mild.  07/25/2017 colonoscopy by Dr. Maximino Greenland: No diverticulosis noted.  Few small polyps resected which showed hyperplastic polyps.  Biopsies of the ileocecal valve showed chronic inflammation which was not a specific finding  Past Medical History:  Diagnosis Date   Allergy    Anemia    Gout    History of ITP    Hyperlipemia    Palpitation    Rocky Mountain spotted fever     Past Surgical History:  Procedure Laterality Date   COLONOSCOPY WITH PROPOFOL N/A 07/25/2017   Procedure: COLONOSCOPY WITH PROPOFOL;  Surgeon: Pasty Spillers, MD;  Location: [facility];  Service: Endoscopy;  Laterality: N/A;   ESOPHAGOGASTRODUODENOSCOPY (EGD) WITH PROPOFOL N/A 07/25/2017   Procedure: ESOPHAGOGASTRODUODENOSCOPY (EGD) WITH PROPOFOL;  Surgeon: Pasty Spillers, MD;  Location: [facility];  Service: Endoscopy;  Laterality: N/A;   KNEE SURGERY Left    SHOULDER SURGERY Right    SPLENECTOMY  1984   Age 62    Prior to Admission medications   Medication Sig Start Date End Date Taking? Authorizing Provider  aspirin EC 81 MG tablet Take 1 tablet (81 mg total) by mouth daily. Swallow whole. 03/29/21   Orbie Pyo, MD  atorvastatin (LIPITOR) 40 MG tablet Take 1 tablet (40 mg total) by mouth daily. Patient not taking: Reported on 08/13/2022 03/29/21   Orbie Pyo, MD  doxycycline (ADOXA) 100 MG tablet Take 100 mg by  mouth 2 (two) times daily. 07/10/22   [provider]  metoprolol tartrate (LOPRESSOR) 100 MG tablet Take 1 tablet (100 mg total) by mouth once for 1 dose. Take 90-120 minutes prior to scan. 02/08/21 02/08/21  Orbie Pyo, MD  rosuvastatin (CRESTOR) 5 MG tablet Take 5 mg by mouth daily. 07/19/22   [provider]    Family History  Problem Relation Age of Onset   Gout Father    Heart disease Father        atrial fib   Healthy Mother    Healthy Brother      Social History   Tobacco Use   Smoking status: Never   Smokeless tobacco: Current    Types: Chew  Vaping Use   Vaping status: Never Used  Substance Use Topics   Alcohol use: Yes   Drug use: Never    Allergies as of 08/20/2022   (No Known Allergies)    Review of Systems:    All systems reviewed and negative except where noted in HPI.   Physical Exam:  There were no vitals taken for this visit. No LMP for male patient. Psych:  Alert and cooperative. Normal mood and affect. General:   Alert,  Well-developed, well-nourished, pleasant and cooperative in NAD Head:  Normocephalic and atraumatic. Eyes:  Sclera clear, no icterus.   Conjunctiva pink. Ears:  Normal auditory acuity. Neck:  Supple; no masses or thyromegaly. Lungs:  Respirations even and unlabored.  Clear throughout to auscultation.   No wheezes, crackles, or rhonchi. No acute distress. Heart:  Regular rate and rhythm; no murmurs, clicks, rubs, or gallops. Abdomen:  Normal bowel sounds.  No bruits.  Soft, non-tender and non-distended without masses, hepatosplenomegaly or hernias noted.  No guarding or rebound tenderness.    Neurologic:  Alert and oriented x3;  grossly normal neurologically. Psych:  Alert and cooperative. Normal mood and affect.  Imaging Studies: No results found.  Assessment and Plan:   COEN MIYASATO is a 47 y.o. y/o male has been referred for ***  Follow up in ***  Dr Wyline Mood MD,MRCP(U.K)    BP check ***

## 2022-08-23 ENCOUNTER — Telehealth: Payer: Self-pay

## 2022-08-23 NOTE — Telephone Encounter (Signed)
Noted.  Will close encounter.  

## 2022-08-23 NOTE — Telephone Encounter (Signed)
Read pt the notes word for word. Pt says thank you.

## 2022-08-23 NOTE — Telephone Encounter (Signed)
Nodify reviewed by Dr. Jayme Cloud- the study is consistent with benign nodule. Will continue observation.   Lm for patient.

## 2022-08-27 ENCOUNTER — Ambulatory Visit (INDEPENDENT_AMBULATORY_CARE_PROVIDER_SITE_OTHER): Payer: BC Managed Care – PPO | Admitting: Gastroenterology

## 2022-08-27 ENCOUNTER — Encounter: Payer: Self-pay | Admitting: Gastroenterology

## 2022-08-27 ENCOUNTER — Other Ambulatory Visit: Payer: Self-pay

## 2022-08-27 VITALS — BP 130/78 | HR 105 | Temp 98.5°F | Wt 274.0 lb

## 2022-08-27 DIAGNOSIS — R948 Abnormal results of function studies of other organs and systems: Secondary | ICD-10-CM

## 2022-08-27 DIAGNOSIS — Z8719 Personal history of other diseases of the digestive system: Secondary | ICD-10-CM

## 2022-08-27 MED ORDER — NA SULFATE-K SULFATE-MG SULF 17.5-3.13-1.6 GM/177ML PO SOLN: 354.0000 mL | Freq: Once | ORAL | 0 refills | Status: AC

## 2022-08-27 NOTE — Progress Notes (Unsigned)
Wyline Mood MD, MRCP(U.K) [address]  [address]  [place], [place] [address]  Main: [phone]  Fax: [phone]   Gastroenterology Consultation  Referring Provider:   Dr. Donneta Romberg Primary Care Physician:  Mick Sell, MD Primary Gastroenterologist:  Dr. Wyline Mood  Reason for Consultation:    Acute diverticulitis        HPI:   Ryan Simmons is a 47 y.o. y/o male referred by Dr. Donneta Romberg after incidental finding of acute diverticulitis on imaging.  This was done during the process of working up a lung nodule. 06/30/2022 CT scan of the chest without contrast showed no GI abnormality except lung nodule this was followed up with a PET scan on 07/09/2022 that showed inflammation in the peritoneal fat adjacent to descending colon suggestive of mild acute diverticulitis.  The patient was treated conservatively without any antibiotics.  Last colonoscopy was in June 2019 by Dr. Maximino Greenland.  Where diverticulosis was seen in the sigmoid colon.  Internal hemorrhoids were noted.  He denies any symptoms whatsoever such as abdominal pain, diarrhea, rectal bleeding, fevers previously as well as at present.  No complaints. Past Medical History:  Diagnosis Date   Allergy    Anemia    Gout    History of ITP    Hyperlipemia    Palpitation    Rocky Mountain spotted fever     Past Surgical History:  Procedure Laterality Date   COLONOSCOPY WITH PROPOFOL N/A 07/25/2017   Procedure: COLONOSCOPY WITH PROPOFOL;  Surgeon: Pasty Spillers, MD;  Location: [facility];  Service: Endoscopy;  Laterality: N/A;   ESOPHAGOGASTRODUODENOSCOPY (EGD) WITH PROPOFOL N/A 07/25/2017   Procedure: ESOPHAGOGASTRODUODENOSCOPY (EGD) WITH PROPOFOL;  Surgeon: Pasty Spillers, MD;  Location: [facility];  Service: Endoscopy;  Laterality: N/A;   KNEE SURGERY Left    SHOULDER SURGERY Right    SPLENECTOMY  1984   Age 80    Prior to Admission medications   Medication Sig Start Date End  Date Taking? Authorizing Provider  aspirin EC 81 MG tablet Take 1 tablet (81 mg total) by mouth daily. Swallow whole. 03/29/21   Orbie Pyo, MD  atorvastatin (LIPITOR) 40 MG tablet Take 1 tablet (40 mg total) by mouth daily. Patient not taking: Reported on 08/13/2022 03/29/21   Orbie Pyo, MD  doxycycline (ADOXA) 100 MG tablet Take 100 mg by mouth 2 (two) times daily. 07/10/22   [provider]  metoprolol tartrate (LOPRESSOR) 100 MG tablet Take 1 tablet (100 mg total) by mouth once for 1 dose. Take 90-120 minutes prior to scan. 02/08/21 02/08/21  Orbie Pyo, MD  rosuvastatin (CRESTOR) 5 MG tablet Take 5 mg by mouth daily. 07/19/22   [provider]    Family History  Problem Relation Age of Onset   Gout Father    Heart disease Father        atrial fib   Healthy Mother    Healthy Brother      Social History   Tobacco Use   Smoking status: Never   Smokeless tobacco: Current    Types: Chew  Vaping Use   Vaping status: Never Used  Substance Use Topics   Alcohol use: Yes   Drug use: Never    Allergies as of 08/27/2022   (No Known Allergies)    Review of Systems:    All systems reviewed and negative except where noted in HPI.   Physical Exam:  BP 130/78   Pulse Marland Kitchen)  105   Temp 98.5 F (36.9 C) (Oral)   Wt 274 lb (124.3 kg)   BMI 36.15 kg/m  No LMP for male patient. Psych:  Alert and cooperative. Normal mood and affect. General:   Alert,  Well-developed, well-nourished, pleasant and cooperative in NAD Head:  Normocephalic and atraumatic. Eyes:  Sclera clear, no icterus.   Conjunctiva pink. Abdomen:  Normal bowel sounds.  No bruits.  Soft, non-tender and non-distended without masses, hepatosplenomegaly or hernias noted.  No guarding or rebound tenderness.    Neurologic:  Alert and oriented x3;  grossly normal neurologically. Psych:  Alert and cooperative. Normal mood and affect.  Imaging Studies: No results found.  Assessment and Plan:    Ryan Simmons is a 47 y.o. y/o male has been referred for findings of acute diverticulitis incidentally on a PET scan which was performed to workup a lung nodule.  The patient was asymptomatic hence treated conservatively without any antibiotics.  Last colonoscopy in 2019 showed no gross abnormalities except sigmoid colon diverticulosis.  Plan 1.  Since he is asymptomatic no further treatment for diverticulitis.  2.  Colonoscopy to rule out any lesion in the colon contributing to diverticulitis.     I have discussed alternative options, risks & benefits,  which include, but are not limited to, bleeding, infection, perforation,respiratory complication & drug reaction.  The patient agrees with this plan & written consent will be obtained.     Follow up in PRN  Dr Wyline Mood MD,MRCP(U.K)

## 2022-09-10 ENCOUNTER — Encounter: Payer: Self-pay | Admitting: Pulmonary Disease

## 2022-11-11 ENCOUNTER — Telehealth: Payer: Self-pay | Admitting: Gastroenterology

## 2022-11-11 NOTE — Telephone Encounter (Signed)
Patient called in to reschedule his procedure

## 2022-11-11 NOTE — Telephone Encounter (Signed)
Called patient back and he stated that he wanted to change his colonoscopy date. I gave him some dates to choose from and he decided on doing his procedure on 12/19/2022. I then called the endoscopy unit and spoke to [facility] and let her know of the new date.

## 2022-11-13 ENCOUNTER — Ambulatory Visit: Payer: BC Managed Care – PPO | Admitting: Pulmonary Disease

## 2022-12-12 ENCOUNTER — Encounter: Payer: Self-pay | Admitting: Gastroenterology

## 2022-12-18 ENCOUNTER — Telehealth: Payer: Self-pay | Admitting: *Deleted

## 2022-12-18 MED ORDER — NA SULFATE-K SULFATE-MG SULF 17.5-3.13-1.6 GM/177ML PO SOLN: 1.0000 | Freq: Once | ORAL | 0 refills | Status: AC

## 2022-12-18 NOTE — Telephone Encounter (Signed)
Trish called patient to give him, the arrival time. However, patient forgot his had colonoscopy schedule for 12/19/2022. Patient have already ate today.  We are requesting to reschedule to 01/16/2023  New instructions will be sent. Patient request to resend the prep solution to [facility] pharmacy.

## 2023-01-14 ENCOUNTER — Telehealth: Payer: Self-pay | Admitting: Gastroenterology

## 2023-01-14 NOTE — Telephone Encounter (Signed)
Patient called in left a voicemail requesting to schedule his colonoscopy due to he has a funeral. I called the patient back to inform her we receive her message, and I sent the message to the scheduler.

## 2023-01-15 NOTE — Telephone Encounter (Signed)
Spoken to patient and requesting to reschedule to next year.  Requesting to 02/20/2023.  Called endo unit to make the change.  New instructions will be sent.

## 2023-02-20 ENCOUNTER — Ambulatory Visit
Admission: RE | Admit: 2023-02-20 | Payer: BC Managed Care – PPO | Source: Home / Self Care | Admitting: Gastroenterology

## 2023-02-20 SURGERY — COLONOSCOPY WITH PROPOFOL
Anesthesia: General

## 2023-05-21 ENCOUNTER — Other Ambulatory Visit: Payer: Self-pay | Admitting: Specialist

## 2023-05-21 DIAGNOSIS — M25522 Pain in left elbow: Secondary | ICD-10-CM

## 2023-05-22 ENCOUNTER — Ambulatory Visit
Admission: RE | Admit: 2023-05-22 | Discharge: 2023-05-22 | Disposition: A | Discharge status: Home / Self Care | Source: Ambulatory Visit | Attending: Specialist | Admitting: Specialist

## 2023-05-22 DIAGNOSIS — M25522 Pain in left elbow: Secondary | ICD-10-CM | POA: Insufficient documentation

## 2023-06-19 ENCOUNTER — Other Ambulatory Visit: Payer: Self-pay | Admitting: Infectious Diseases

## 2023-06-19 ENCOUNTER — Encounter: Payer: Self-pay | Admitting: Infectious Diseases

## 2023-06-19 DIAGNOSIS — K760 Fatty (change of) liver, not elsewhere classified: Secondary | ICD-10-CM

## 2023-06-19 DIAGNOSIS — M109 Gout, unspecified: Secondary | ICD-10-CM

## 2023-06-19 DIAGNOSIS — E785 Hyperlipidemia, unspecified: Secondary | ICD-10-CM

## 2023-06-19 DIAGNOSIS — R053 Chronic cough: Secondary | ICD-10-CM

## 2023-06-19 DIAGNOSIS — Z9081 Acquired absence of spleen: Secondary | ICD-10-CM

## 2023-06-19 DIAGNOSIS — Z0000 Encounter for general adult medical examination without abnormal findings: Secondary | ICD-10-CM

## 2023-06-19 DIAGNOSIS — R7303 Prediabetes: Secondary | ICD-10-CM

## 2023-06-19 DIAGNOSIS — D696 Thrombocytopenia, unspecified: Secondary | ICD-10-CM

## 2023-06-25 ENCOUNTER — Ambulatory Visit
Admission: RE | Admit: 2023-06-25 | Discharge: 2023-06-25 | Disposition: A | Discharge status: Home / Self Care | Source: Ambulatory Visit | Attending: Infectious Diseases | Admitting: Infectious Diseases

## 2023-06-25 DIAGNOSIS — Z9081 Acquired absence of spleen: Secondary | ICD-10-CM | POA: Insufficient documentation

## 2023-06-25 DIAGNOSIS — M109 Gout, unspecified: Secondary | ICD-10-CM | POA: Insufficient documentation

## 2023-06-25 DIAGNOSIS — D696 Thrombocytopenia, unspecified: Secondary | ICD-10-CM | POA: Insufficient documentation

## 2023-06-25 DIAGNOSIS — R053 Chronic cough: Secondary | ICD-10-CM | POA: Insufficient documentation

## 2023-06-25 DIAGNOSIS — R7303 Prediabetes: Secondary | ICD-10-CM | POA: Diagnosis present

## 2023-06-25 DIAGNOSIS — E785 Hyperlipidemia, unspecified: Secondary | ICD-10-CM | POA: Diagnosis present

## 2023-06-25 DIAGNOSIS — Z0000 Encounter for general adult medical examination without abnormal findings: Secondary | ICD-10-CM | POA: Diagnosis present

## 2023-06-25 DIAGNOSIS — K760 Fatty (change of) liver, not elsewhere classified: Secondary | ICD-10-CM | POA: Diagnosis present

## 2023-07-14 ENCOUNTER — Ambulatory Visit: Payer: Self-pay | Admitting: Orthopedic Surgery

## 2023-07-14 NOTE — H&P (Signed)
 Ryan Simmons is an 48 y.o. male.   Chief Complaint: L shoulder pain HPI: Visit For: Follow up (to discuss MRI) Location: left; elbow Duration: date of onset: (05/21/2023); 5 days Severity: no pain Timing: acute Context: lifting Alleviating Factors: rest Aggravating Factors: lifting; pushing/pulling; ROM Associated Symptoms: ecchymosis Prior Imaging: x ray; MRI  Past Medical History:  Diagnosis Date   Allergy    Anemia    Gout    History of ITP    Hyperlipemia    Palpitation    Rocky Mountain spotted fever     Past Surgical History:  Procedure Laterality Date   COLONOSCOPY WITH PROPOFOL  N/A 07/25/2017   Procedure: COLONOSCOPY WITH PROPOFOL ;  Surgeon: Irby Mannan, MD;  Location: [facility];  Service: Endoscopy;  Laterality: N/A;   ESOPHAGOGASTRODUODENOSCOPY (EGD) WITH PROPOFOL  N/A 07/25/2017   Procedure: ESOPHAGOGASTRODUODENOSCOPY (EGD) WITH PROPOFOL ;  Surgeon: Irby Mannan, MD;  Location: [facility];  Service: Endoscopy;  Laterality: N/A;   KNEE SURGERY Left    SHOULDER SURGERY Right    SPLENECTOMY  1984   Age 42    Family History  Problem Relation Age of Onset   Gout Father    Heart disease Father        atrial fib   Healthy Mother    Healthy Brother    Social History:  reports that he has never smoked. His smokeless tobacco use includes chew. He reports current alcohol use. He reports that he does not use drugs.  Allergies: No Known Allergies  Current meds: chlorhexidine gluconate 4 % topical liquid IBU 800 mg tablet  Review of Systems  Constitutional: Negative.   HENT: Negative.    Eyes: Negative.   Respiratory: Negative.    Cardiovascular: Negative.   Gastrointestinal: Negative.   Endocrine: Negative.   Genitourinary: Negative.   Musculoskeletal:  Positive for arthralgias, joint swelling and myalgias.  Skin: Negative.   Neurological: Negative.   Hematological: Negative.   Psychiatric/Behavioral: Negative.      There  were no vitals taken for this visit. Physical Exam Constitutional:      Appearance: Normal appearance.  HENT:     Head: Normocephalic and atraumatic.     Right Ear: External ear normal.     Left Ear: External ear normal.     Nose: Nose normal.     Mouth/Throat:     Pharynx: Oropharynx is clear.  Eyes:     Conjunctiva/sclera: Conjunctivae normal.  Cardiovascular:     Rate and Rhythm: Normal rate and regular rhythm.     Pulses: Normal pulses.  Pulmonary:     Effort: Pulmonary effort is normal.  Abdominal:     General: Bowel sounds are normal.  Musculoskeletal:     Cervical back: Normal range of motion.     Comments: Constitutional: General Appearance: healthy-appearing and NAD.  Psychiatric: Mood and Affect: normal mood and affect.  Cardiovascular System: Arterial Pulses Left: radial normal and brachial normal. Edema Left: none. Varicosities Right: no varicosities. Varicosities Left: no varicosities.  C-Spine/Neck: Active Range of Motion: flexion normal, extension normal, and no pain elicited on motion.  Shoulders: Inspection Left: no misalignment, atrophy, erythema, swelling, or scapular winging. Bony Palpation Left: no tenderness of the sternoclavicular joint, the coracoid process, the acromioclavicular joint, the bicipital groove, or the scapula. Soft Tissue Palpation Left: no tenderness of the infraspinatus, the teres minor, the subacromial bursa, the axilla, the glenohumeral joint region, the pectoralis major insertion, the sternocleidomastoid, the costochondral junction, the trapezius, the rhomboid,  the latissimus dorsi, the serratus, the deltoid, the levator scapulae, or the lateral cuff insertion and tenderness of the supraspinatus and the subdeltoid bursa. Active Range of Motion Left: limited. Special Tests Left: Speed's test negative and Neer's test positive. Stability Left: no laxity, sulcus sign negative, and anterior apprehension test negative. Strength Left: abduction 5/5,  adduction 5/5, flexion 5/5, and extension 5/5.  Skin: Left Upper Extremity: normal.  Neurological System: Biceps Reflex Left: normal (2). Brachioradialis Reflex Left: normal (2). Triceps Reflex Left: normal (2). Sensation on the Left: C5 normal, C6 normal, and C7 normal.  He has some ecchymosis in the proximal arm. A prominence consistent with a long head of the biceps tendon tear. He is not tender anymore distally. He has got excellent resistance to flexion into supination of the arm  Skin:    General: Skin is warm and dry.  Neurological:     Mental Status: He is alert.    MRI of the elbow demonstrates common forearm tendinosis. Distal biceps tendon is intact.  MRI shoulder demonstrates long head biceps tendinosis mild chondromalacia of the glenohumeral joint. Mild supraspinatus infraspinatus tendinosis. Labral degeneration with extensive tearing the posterior labrum extending posteriorly through the inferior labrum with multiple paralabral cyst.  Assessment/Plan Impression:  1. Status post lifting injury with no evidence of distal biceps tendon tear. With the recent proximal ecchymosis and prominence consistent with a long head of the biceps tendon tear. With pain consistent with impingement pain of the left shoulder. Though has underlying degenerative changes of glenohumeral joint and posterior labral degenerative tearing and cystic formation  Plan:  We discussed options including living with his symptoms excetra. Especially the biceps tendon tear I indicated typically we do not repair that. For his impingement pain refractory conservative treatment and temporary relief from a subacromial injection we discussed subacromial decompression and bursectomy acromioplasty. Evaluation of possible of the posterior labrum into glenohumeral joint where he does have some arthritis. And debridement of his biceps tendon.  I had an extensive discussion with the patient concerning her pathology and  relevant anatomy. To the persistence of their symptoms despite conservative treatment to include an exercise program, activity modification and injections we discussed proceeding with a shoulder arthroscopy. Discussed the procedure in detail including the risks and benefits of improvement in symptoms, worsening of their symptoms, no changes in her symptoms. I discussed decompressing the subacromial space by a bursectomy and CA ligament release as well as acromioplasty if needed. Additionally discussed evaluating the labrum and biceps if appropriate. In addition I discussed the possibility that if an occult tear of the rotator cuff is noted that it may require a mini open rotator cuff repair. In addition discussed the outpatient procedure. Follow-up in 2 weeks. Pendulum exercises and appropriate postoperative analgesics. I discussed residual symptoms related to his arthritis and his degenerative tearing of the labrum.  No history of DVT or MRSA.  Oxycodone, Kefzol, aspirin   Preoperative clearance  Plan L shoulder scope, SAD, debridement of labrum and bicep tendon  Barba Levin, PA-C for Dr Leighton Punches 07/14/2023, 3:43 PM

## 2023-07-14 NOTE — H&P (View-Only) (Signed)
 Ryan Simmons is an 48 y.o. male.   Chief Complaint: L shoulder pain HPI: Visit For: Follow up (to discuss MRI) Location: left; elbow Duration: date of onset: (05/21/2023); 5 days Severity: no pain Timing: acute Context: lifting Alleviating Factors: rest Aggravating Factors: lifting; pushing/pulling; ROM Associated Symptoms: ecchymosis Prior Imaging: x ray; MRI  Past Medical History:  Diagnosis Date   Allergy    Anemia    Gout    History of ITP    Hyperlipemia    Palpitation    [place] spotted fever     Past Surgical History:  Procedure Laterality Date   COLONOSCOPY WITH PROPOFOL  N/A 07/25/2017   Procedure: COLONOSCOPY WITH PROPOFOL ;  Surgeon: Irby Mannan, MD;  Location: [facility];  Service: Endoscopy;  Laterality: N/A;   ESOPHAGOGASTRODUODENOSCOPY (EGD) WITH PROPOFOL  N/A 07/25/2017   Procedure: ESOPHAGOGASTRODUODENOSCOPY (EGD) WITH PROPOFOL ;  Surgeon: Irby Mannan, MD;  Location: [facility];  Service: Endoscopy;  Laterality: N/A;   KNEE SURGERY Left    SHOULDER SURGERY Right    SPLENECTOMY  1984   Age 42    Family History  Problem Relation Age of Onset   Gout Father    Heart disease Father        atrial fib   Healthy Mother    Healthy Brother    Social History:  reports that he has never smoked. His smokeless tobacco use includes chew. He reports current alcohol use. He reports that he does not use drugs.  Allergies: No Known Allergies  Current meds: chlorhexidine gluconate 4 % topical liquid IBU 800 mg tablet  Review of Systems  Constitutional: Negative.   HENT: Negative.    Eyes: Negative.   Respiratory: Negative.    Cardiovascular: Negative.   Gastrointestinal: Negative.   Endocrine: Negative.   Genitourinary: Negative.   Musculoskeletal:  Positive for arthralgias, joint swelling and myalgias.  Skin: Negative.   Neurological: Negative.   Hematological: Negative.   Psychiatric/Behavioral: Negative.      There  were no vitals taken for this visit. Physical Exam Constitutional:      Appearance: Normal appearance.  HENT:     Head: Normocephalic and atraumatic.     Right Ear: External ear normal.     Left Ear: External ear normal.     Nose: Nose normal.     Mouth/Throat:     Pharynx: Oropharynx is clear.  Eyes:     Conjunctiva/sclera: Conjunctivae normal.  Cardiovascular:     Rate and Rhythm: Normal rate and regular rhythm.     Pulses: Normal pulses.  Pulmonary:     Effort: Pulmonary effort is normal.  Abdominal:     General: Bowel sounds are normal.  Musculoskeletal:     Cervical back: Normal range of motion.     Comments: Constitutional: General Appearance: healthy-appearing and NAD.  Psychiatric: Mood and Affect: normal mood and affect.  Cardiovascular System: Arterial Pulses Left: radial normal and brachial normal. Edema Left: none. Varicosities Right: no varicosities. Varicosities Left: no varicosities.  C-Spine/Neck: Active Range of Motion: flexion normal, extension normal, and no pain elicited on motion.  Shoulders: Inspection Left: no misalignment, atrophy, erythema, swelling, or scapular winging. Bony Palpation Left: no tenderness of the sternoclavicular joint, the coracoid process, the acromioclavicular joint, the bicipital groove, or the scapula. Soft Tissue Palpation Left: no tenderness of the infraspinatus, the teres minor, the subacromial bursa, the axilla, the glenohumeral joint region, the pectoralis major insertion, the sternocleidomastoid, the costochondral junction, the trapezius, the rhomboid,  the latissimus dorsi, the serratus, the deltoid, the levator scapulae, or the lateral cuff insertion and tenderness of the supraspinatus and the subdeltoid bursa. Active Range of Motion Left: limited. Special Tests Left: Speed's test negative and Neer's test positive. Stability Left: no laxity, sulcus sign negative, and anterior apprehension test negative. Strength Left: abduction 5/5,  adduction 5/5, flexion 5/5, and extension 5/5.  Skin: Left Upper Extremity: normal.  Neurological System: Biceps Reflex Left: normal (2). Brachioradialis Reflex Left: normal (2). Triceps Reflex Left: normal (2). Sensation on the Left: C5 normal, C6 normal, and C7 normal.  He has some ecchymosis in the proximal arm. A prominence consistent with a long head of the biceps tendon tear. He is not tender anymore distally. He has got excellent resistance to flexion into supination of the arm  Skin:    General: Skin is warm and dry.  Neurological:     Mental Status: He is alert.    MRI of the elbow demonstrates common forearm tendinosis. Distal biceps tendon is intact.  MRI shoulder demonstrates long head biceps tendinosis mild chondromalacia of the glenohumeral joint. Mild supraspinatus infraspinatus tendinosis. Labral degeneration with extensive tearing the posterior labrum extending posteriorly through the inferior labrum with multiple paralabral cyst.  Assessment/Plan Impression:  1. Status post lifting injury with no evidence of distal biceps tendon tear. With the recent proximal ecchymosis and prominence consistent with a long head of the biceps tendon tear. With pain consistent with impingement pain of the left shoulder. Though has underlying degenerative changes of glenohumeral joint and posterior labral degenerative tearing and cystic formation  Plan:  We discussed options including living with his symptoms excetra. Especially the biceps tendon tear I indicated typically we do not repair that. For his impingement pain refractory conservative treatment and temporary relief from a subacromial injection we discussed subacromial decompression and bursectomy acromioplasty. Evaluation of possible of the posterior labrum into glenohumeral joint where he does have some arthritis. And debridement of his biceps tendon.  I had an extensive discussion with the patient concerning her pathology and  relevant anatomy. To the persistence of their symptoms despite conservative treatment to include an exercise program, activity modification and injections we discussed proceeding with a shoulder arthroscopy. Discussed the procedure in detail including the risks and benefits of improvement in symptoms, worsening of their symptoms, no changes in her symptoms. I discussed decompressing the subacromial space by a bursectomy and CA ligament release as well as acromioplasty if needed. Additionally discussed evaluating the labrum and biceps if appropriate. In addition I discussed the possibility that if an occult tear of the rotator cuff is noted that it may require a mini open rotator cuff repair. In addition discussed the outpatient procedure. Follow-up in 2 weeks. Pendulum exercises and appropriate postoperative analgesics. I discussed residual symptoms related to his arthritis and his degenerative tearing of the labrum.  No history of DVT or MRSA.  Oxycodone, Kefzol, aspirin   Preoperative clearance  Plan L shoulder scope, SAD, debridement of labrum and bicep tendon  Barba Levin, PA-C for Dr Leighton Punches 07/14/2023, 3:43 PM

## 2023-07-17 NOTE — Patient Instructions (Addendum)
 SURGICAL WAITING ROOM VISITATION  Patients having surgery or a procedure may have no more than 2 support people in the waiting area - these visitors may rotate.    Children under the age of 70 must have an adult with them who is not the patient.  Visitors with respiratory illnesses are discouraged from visiting and should remain at home.  If the patient needs to stay at the hospital during part of their recovery, the visitor guidelines for inpatient rooms apply. Pre-op nurse will coordinate an appropriate time for 1 support person to accompany patient in pre-op.  This support person may not rotate.    Please refer to the [facility] website for the visitor guidelines for Inpatients (after your surgery is over and you are in a regular room).       Your procedure is scheduled on: 07/23/23   Report to [facility] Main Entrance    Report to admitting at : 9:10 AM   Call this number if you have problems the morning of surgery [phone]   Do not eat food :After Midnight.   After Midnight you may have the following liquids until: 8:20 AM DAY OF SURGERY  Water Non-Citrus Juices (without pulp, NO RED-Apple, White grape, White cranberry) Black Coffee (NO MILK/CREAM OR CREAMERS, sugar ok)  Clear Tea (NO MILK/CREAM OR CREAMERS, sugar ok) regular and decaf                             Plain Jell-O (NO RED)                                           Fruit ices (not with fruit pulp, NO RED)                                     Popsicles (NO RED)                                                               Sports drinks like Gatorade (NO RED)   The day of surgery:  Drink ONE (1) Pre-Surgery Clear Ensure at : 8:20 AM the morning of surgery. Drink in one sitting. Do not sip.  This drink was given to you during your hospital  pre-op appointment visit. Nothing else to drink after completing the  Pre-Surgery Clear Ensure or G2.          If you have questions, please contact your  surgeon's office.  FOLLOW ANY ADDITIONAL PRE OP INSTRUCTIONS YOU RECEIVED FROM YOUR SURGEON'S OFFICE!!!   Oral Hygiene is also important to reduce your risk of infection.                                    Remember - BRUSH YOUR TEETH THE MORNING OF SURGERY WITH YOUR REGULAR TOOTHPASTE  DENTURES WILL BE REMOVED PRIOR TO SURGERY PLEASE DO NOT APPLY Poly grip OR ADHESIVES!!!   Do NOT smoke after Midnight   Stop all vitamins  and herbal supplements 7 days before surgery.   Take these medicines the morning of surgery with A SIP OF WATER: NONE                              You may not have any metal on your body including hair pins, jewelry, and body piercing             Do not wear lotions, powders, perfumes/cologne, or deodorant              Men may shave face and neck.   Do not bring valuables to the hospital. [facility] IS NOT             RESPONSIBLE   FOR VALUABLES.   Contacts, glasses, dentures or bridgework may not be worn into surgery.   Bring small overnight bag day of surgery.   DO NOT BRING YOUR HOME MEDICATIONS TO THE HOSPITAL. PHARMACY WILL DISPENSE MEDICATIONS LISTED ON YOUR MEDICATION LIST TO YOU DURING YOUR ADMISSION IN THE HOSPITAL!    Patients discharged on the day of surgery will not be allowed to drive home.  Someone NEEDS to stay with you for the first 24 hours after anesthesia.   Special Instructions: Bring a copy of your healthcare power of attorney and living will documents the day of surgery if you haven't scanned them before.              Please read over the following fact sheets you were given: IF YOU HAVE QUESTIONS ABOUT YOUR PRE-OP INSTRUCTIONS PLEASE CALL [phone]   If you received a COVID test during your pre-op visit  it is requested that you wear a mask when out in public, stay away from anyone that may not be feeling well and notify your surgeon if you develop symptoms. If you test positive for Covid or have been in contact with anyone that has  tested positive in the last 10 days please notify you surgeon.    Ryan Simmons - Preparing for Surgery Before surgery, you can play an important role.  Because skin is not sterile, your skin needs to be as free of germs as possible.  You can reduce the number of germs on your skin by washing with CHG (chlorahexidine gluconate) soap before surgery.  CHG is an antiseptic cleaner which kills germs and bonds with the skin to continue killing germs even after washing. Please DO NOT use if you have an allergy to CHG or antibacterial soaps.  If your skin becomes reddened/irritated stop using the CHG and inform your nurse when you arrive at Short Stay. Do not shave (including legs and underarms) for at least 48 hours prior to the first CHG shower.  You may shave your face/neck. Please follow these instructions carefully:  1.  Shower with CHG Soap the night before surgery and the  morning of Surgery.  2.  If you choose to wash your hair, wash your hair first as usual with your  normal  shampoo.  3.  After you shampoo, rinse your hair and body thoroughly to remove the  shampoo.                           4.  Use CHG as you would any other liquid soap.  You can apply chg directly  to the skin and wash  Gently with a scrungie or clean washcloth.  5.  Apply the CHG Soap to your body ONLY FROM THE NECK DOWN.   Do not use on face/ open                           Wound or open sores. Avoid contact with eyes, ears mouth and genitals (private parts).                       Wash face,  Genitals (private parts) with your normal soap.             6.  Wash thoroughly, paying special attention to the area where your surgery  will be performed.  7.  Thoroughly rinse your body with warm water from the neck down.  8.  DO NOT shower/wash with your normal soap after using and rinsing off  the CHG Soap.                9.  Pat yourself dry with a clean towel.            10.  Wear clean pajamas.            11.   Place clean sheets on your bed the night of your first shower and do not  sleep with pets. Day of Surgery : Do not apply any lotions/deodorants the morning of surgery.  Please wear clean clothes to the hospital/surgery center.  FAILURE TO FOLLOW THESE INSTRUCTIONS MAY RESULT IN THE CANCELLATION OF YOUR SURGERY PATIENT SIGNATURE_________________________________  NURSE SIGNATURE__________________________________  ________________________________________________________________________  Ryan Simmons  An incentive spirometer is a tool that can help keep your lungs clear and active. This tool measures how well you are filling your lungs with each breath. Taking long deep breaths may help reverse or decrease the chance of developing breathing (pulmonary) problems (especially infection) following: A long period of time when you are unable to move or be active. BEFORE THE PROCEDURE  If the spirometer includes an indicator to show your best effort, your nurse or respiratory therapist will set it to a desired goal. If possible, sit up straight or lean slightly forward. Try not to slouch. Hold the incentive spirometer in an upright position. INSTRUCTIONS FOR USE  Sit on the edge of your bed if possible, or sit up as far as you can in bed or on a chair. Hold the incentive spirometer in an upright position. Breathe out normally. Place the mouthpiece in your mouth and seal your lips tightly around it. Breathe in slowly and as deeply as possible, raising the piston or the ball toward the top of the column. Hold your breath for 3-5 seconds or for as long as possible. Allow the piston or ball to fall to the bottom of the column. Remove the mouthpiece from your mouth and breathe out normally. Rest for a few seconds and repeat Steps 1 through 7 at least 10 times every 1-2 hours when you are awake. Take your time and take a few normal breaths between deep breaths. The spirometer may include an  indicator to show your best effort. Use the indicator as a goal to work toward during each repetition. After each set of 10 deep breaths, practice coughing to be sure your lungs are clear. If you have an incision (the cut made at the time of surgery), support your incision when coughing by placing a pillow or rolled up towels firmly  against it. Once you are able to get out of bed, walk around indoors and cough well. You may stop using the incentive spirometer when instructed by your caregiver.  RISKS AND COMPLICATIONS Take your time so you do not get dizzy or light-headed. If you are in pain, you may need to take or ask for pain medication before doing incentive spirometry. It is harder to take a deep breath if you are having pain. AFTER USE Rest and breathe slowly and easily. It can be helpful to keep track of a log of your progress. Your caregiver can provide you with a simple table to help with this. If you are using the spirometer at home, follow these instructions: SEEK MEDICAL CARE IF:  You are having difficultly using the spirometer. You have trouble using the spirometer as often as instructed. Your pain medication is not giving enough relief while using the spirometer. You develop fever of 100.5 F (38.1 C) or higher. SEEK IMMEDIATE MEDICAL CARE IF:  You cough up bloody sputum that had not been present before. You develop fever of 102 F (38.9 C) or greater. You develop worsening pain at or near the incision site. MAKE SURE YOU:  Understand these instructions. Will watch your condition. Will get help right away if you are not doing well or get worse. Document Released: 06/03/2006 Document Revised: 04/15/2011 Document Reviewed: 08/04/2006 [facility] Patient Information 2014 [place], [place].   ________________________________________________________________________

## 2023-07-18 ENCOUNTER — Encounter (HOSPITAL_COMMUNITY): Payer: Self-pay

## 2023-07-18 ENCOUNTER — Other Ambulatory Visit: Payer: Self-pay

## 2023-07-18 ENCOUNTER — Encounter (HOSPITAL_COMMUNITY)
Admission: RE | Admit: 2023-07-18 | Discharge: 2023-07-18 | Disposition: A | Discharge status: Home / Self Care | Source: Ambulatory Visit | Attending: Specialist | Admitting: Specialist

## 2023-07-18 VITALS — BP 132/88 | HR 71 | Temp 98.6°F | Ht 73.0 in | Wt 275.0 lb

## 2023-07-18 DIAGNOSIS — Z01818 Encounter for other preprocedural examination: Secondary | ICD-10-CM | POA: Diagnosis present

## 2023-07-18 DIAGNOSIS — R002 Palpitations: Secondary | ICD-10-CM | POA: Diagnosis not present

## 2023-07-18 DIAGNOSIS — Z01810 Encounter for preprocedural cardiovascular examination: Secondary | ICD-10-CM | POA: Diagnosis not present

## 2023-07-18 NOTE — Progress Notes (Addendum)
 For Anesthesia: PCP - Eartha Gold, MD : Holli Lunger: 06/17/23 Cardiologist - N/A  Bowel Prep reminder:  Chest x-ray - CT chest: 07/06/23. CT coronary: 03/22/21 EKG - 06/17/23: requested: Media tab. Stress Test -  ECHO - 03/23/21 Cardiac Cath -  Pacemaker/ICD device last checked: Pacemaker orders received: Device Rep notified:  Spinal Cord Stimulator:N/A  Sleep Study - N/A CPAP -   Fasting Blood Sugar - N/A Checks Blood Sugar _____ times a day Date and result of last Hgb A1c-  Last dose of GLP1 agonist- N/A GLP1 instructions:   Last dose of SGLT-2 inhibitors- N/A SGLT-2 instructions:   Blood Thinner Instructions:N/A Aspirin  Instructions: Last Dose:  Activity level: Can go up a flight of stairs and activities of daily living without stopping and without chest pain and/or shortness of breath   Able to exercise without chest pain and/or shortness of breath  Anesthesia review: Hx: Palpitations  Patient denies shortness of breath, fever, cough and chest pain at PAT appointment   Patient verbalized understanding of instructions that were reviewed over the telephone.

## 2023-07-23 ENCOUNTER — Ambulatory Visit (HOSPITAL_COMMUNITY): Admitting: Registered Nurse

## 2023-07-23 ENCOUNTER — Encounter (HOSPITAL_COMMUNITY)
Admission: RE | Disposition: A | Discharge status: Home / Self Care | Payer: Self-pay | Source: Home / Self Care | Attending: Specialist

## 2023-07-23 ENCOUNTER — Ambulatory Visit (HOSPITAL_COMMUNITY)
Admission: RE | Admit: 2023-07-23 | Discharge: 2023-07-23 | Disposition: A | Discharge status: Home / Self Care | Attending: Specialist | Admitting: Specialist

## 2023-07-23 ENCOUNTER — Encounter (HOSPITAL_COMMUNITY): Payer: Self-pay | Admitting: Specialist

## 2023-07-23 DIAGNOSIS — S46112A Strain of muscle, fascia and tendon of long head of biceps, left arm, initial encounter: Secondary | ICD-10-CM | POA: Diagnosis not present

## 2023-07-23 DIAGNOSIS — Z6836 Body mass index (BMI) 36.0-36.9, adult: Secondary | ICD-10-CM | POA: Diagnosis not present

## 2023-07-23 DIAGNOSIS — M2550 Pain in unspecified joint: Secondary | ICD-10-CM | POA: Insufficient documentation

## 2023-07-23 DIAGNOSIS — X58XXXA Exposure to other specified factors, initial encounter: Secondary | ICD-10-CM | POA: Insufficient documentation

## 2023-07-23 DIAGNOSIS — M7552 Bursitis of left shoulder: Secondary | ICD-10-CM | POA: Diagnosis not present

## 2023-07-23 DIAGNOSIS — S43432A Superior glenoid labrum lesion of left shoulder, initial encounter: Secondary | ICD-10-CM | POA: Insufficient documentation

## 2023-07-23 DIAGNOSIS — E66813 Obesity, class 3: Secondary | ICD-10-CM | POA: Insufficient documentation

## 2023-07-23 DIAGNOSIS — M7542 Impingement syndrome of left shoulder: Secondary | ICD-10-CM | POA: Insufficient documentation

## 2023-07-23 DIAGNOSIS — M25812 Other specified joint disorders, left shoulder: Secondary | ICD-10-CM | POA: Diagnosis present

## 2023-07-23 HISTORY — PX: SHOULDER ARTHROSCOPY WITH ROTATOR CUFF REPAIR: SHX5685

## 2023-07-23 SURGERY — ARTHROSCOPY, SHOULDER, WITH ROTATOR CUFF REPAIR
Anesthesia: General | Laterality: Left

## 2023-07-23 MED ORDER — ACETAMINOPHEN 500 MG PO TABS
1000.0000 mg | ORAL_TABLET | Freq: Once | ORAL | Status: AC
  Administered 2023-07-23: 1000 mg via ORAL

## 2023-07-23 MED ORDER — ORAL CARE MOUTH RINSE: 15.0000 mL | Freq: Once | OROMUCOSAL | Status: AC

## 2023-07-23 MED ORDER — ONDANSETRON HCL 4 MG/2ML IJ SOLN
INTRAMUSCULAR | Status: AC
Start: 2023-07-23 — End: 2023-07-23
  Filled 2023-07-23: qty 2

## 2023-07-23 MED ORDER — SUGAMMADEX SODIUM 200 MG/2ML IV SOLN
INTRAVENOUS | Status: DC | PRN
Start: 2023-07-23 — End: 2023-07-23
  Administered 2023-07-23: 200 mg via INTRAVENOUS

## 2023-07-23 MED ORDER — LACTATED RINGERS IV SOLN: INTRAVENOUS | Status: DC

## 2023-07-23 MED ORDER — BUPIVACAINE-EPINEPHRINE (PF) 0.5% -1:200000 IJ SOLN
INTRAMUSCULAR | Status: DC | PRN
  Administered 2023-07-23: 15 mL via PERINEURAL

## 2023-07-23 MED ORDER — BUPIVACAINE LIPOSOME 1.3 % IJ SUSP
INTRAMUSCULAR | Status: DC | PRN
  Administered 2023-07-23: 10 mL via PERINEURAL

## 2023-07-23 MED ORDER — ONDANSETRON HCL 4 MG/2ML IJ SOLN
INTRAMUSCULAR | Status: DC | PRN
  Administered 2023-07-23: 4 mg via INTRAVENOUS

## 2023-07-23 MED ORDER — BUPIVACAINE-EPINEPHRINE 0.5% -1:200000 IJ SOLN
INTRAMUSCULAR | Status: DC | PRN
  Administered 2023-07-23: 30 mL

## 2023-07-23 MED ORDER — FENTANYL CITRATE (PF) 100 MCG/2ML IJ SOLN
INTRAMUSCULAR | Status: AC
  Filled 2023-07-23: qty 2

## 2023-07-23 MED ORDER — BUPIVACAINE-EPINEPHRINE (PF) 0.5% -1:200000 IJ SOLN
INTRAMUSCULAR | Status: AC
  Filled 2023-07-23: qty 30

## 2023-07-23 MED ORDER — CHLORHEXIDINE GLUCONATE 0.12 % MT SOLN
15.0000 mL | Freq: Once | OROMUCOSAL | Status: AC
  Administered 2023-07-23: 15 mL via OROMUCOSAL

## 2023-07-23 MED ORDER — ROCURONIUM BROMIDE 10 MG/ML (PF) SYRINGE
PREFILLED_SYRINGE | INTRAVENOUS | Status: AC
  Filled 2023-07-23: qty 10

## 2023-07-23 MED ORDER — MIDAZOLAM HCL 2 MG/2ML IJ SOLN: 2.0000 mg | INTRAMUSCULAR | Status: DC

## 2023-07-23 MED ORDER — FENTANYL CITRATE (PF) 100 MCG/2ML IJ SOLN
INTRAMUSCULAR | Status: DC | PRN
  Administered 2023-07-23: 25 ug via INTRAVENOUS

## 2023-07-23 MED ORDER — FENTANYL CITRATE PF 50 MCG/ML IJ SOSY: 100.0000 ug | PREFILLED_SYRINGE | INTRAMUSCULAR | Status: DC

## 2023-07-23 MED ORDER — DEXAMETHASONE SODIUM PHOSPHATE 10 MG/ML IJ SOLN
INTRAMUSCULAR | Status: DC | PRN
Start: 2023-07-23 — End: 2023-07-23
  Administered 2023-07-23: 10 mg via INTRAVENOUS

## 2023-07-23 MED ORDER — ROCURONIUM BROMIDE 10 MG/ML (PF) SYRINGE
PREFILLED_SYRINGE | INTRAVENOUS | Status: DC | PRN
  Administered 2023-07-23: 60 mg via INTRAVENOUS

## 2023-07-23 MED ORDER — MIDAZOLAM HCL 2 MG/2ML IJ SOLN
2.0000 mg | INTRAMUSCULAR | Status: DC
  Administered 2023-07-23: 2 mg via INTRAVENOUS
  Filled 2023-07-23: qty 2

## 2023-07-23 MED ORDER — PROPOFOL 10 MG/ML IV BOLUS
INTRAVENOUS | Status: AC
  Filled 2023-07-23: qty 20

## 2023-07-23 MED ORDER — EPINEPHRINE PF 1 MG/ML IJ SOLN
INTRAMUSCULAR | Status: AC
Start: 2023-07-23 — End: 2023-07-23
  Filled 2023-07-23: qty 1

## 2023-07-23 MED ORDER — EPINEPHRINE PF 1 MG/ML IJ SOLN
INTRAMUSCULAR | Status: DC | PRN
  Administered 2023-07-23: 1 mg

## 2023-07-23 MED ORDER — TRANEXAMIC ACID-NACL 1000-0.7 MG/100ML-% IV SOLN
1000.0000 mg | INTRAVENOUS | Status: AC
  Administered 2023-07-23: 1000 mg via INTRAVENOUS
  Filled 2023-07-23: qty 100

## 2023-07-23 MED ORDER — ASPIRIN 81 MG PO TBEC: 81.0000 mg | DELAYED_RELEASE_TABLET | Freq: Two times a day (BID) | ORAL | 1 refills | Status: AC

## 2023-07-23 MED ORDER — HYDROMORPHONE HCL 1 MG/ML IJ SOLN: 0.2500 mg | INTRAMUSCULAR | Status: DC | PRN

## 2023-07-23 MED ORDER — FENTANYL CITRATE (PF) 100 MCG/2ML IJ SOLN: INTRAMUSCULAR | Status: DC | PRN

## 2023-07-23 MED ORDER — FENTANYL CITRATE PF 50 MCG/ML IJ SOSY
100.0000 ug | PREFILLED_SYRINGE | INTRAMUSCULAR | Status: DC
  Administered 2023-07-23: 100 ug via INTRAVENOUS
  Filled 2023-07-23: qty 2

## 2023-07-23 MED ORDER — DEXAMETHASONE SODIUM PHOSPHATE 10 MG/ML IJ SOLN
INTRAMUSCULAR | Status: AC
  Filled 2023-07-23: qty 1

## 2023-07-23 MED ORDER — PHENYLEPHRINE HCL-NACL 20-0.9 MG/250ML-% IV SOLN
INTRAVENOUS | Status: DC | PRN
  Administered 2023-07-23: 25 ug/min via INTRAVENOUS

## 2023-07-23 MED ORDER — POLYETHYLENE GLYCOL 3350 17 G PO PACK: 17.0000 g | PACK | Freq: Every day | ORAL | 0 refills | Status: AC

## 2023-07-23 MED ORDER — SODIUM CHLORIDE 0.9 % IR SOLN
Status: DC | PRN
  Administered 2023-07-23 (×2): 3000 mL

## 2023-07-23 MED ORDER — PROPOFOL 10 MG/ML IV BOLUS
INTRAVENOUS | Status: DC | PRN
  Administered 2023-07-23: 300 mg via INTRAVENOUS

## 2023-07-23 MED ORDER — CEFAZOLIN SODIUM-DEXTROSE 3-4 GM/150ML-% IV SOLN
3.0000 g | INTRAVENOUS | Status: AC
  Administered 2023-07-23: 3 g via INTRAVENOUS
  Filled 2023-07-23: qty 150

## 2023-07-23 MED ORDER — METHOCARBAMOL 750 MG PO TABS: 750.0000 mg | ORAL_TABLET | Freq: Three times a day (TID) | ORAL | 1 refills | Status: AC | PRN

## 2023-07-23 MED ORDER — OXYCODONE-ACETAMINOPHEN 10-325 MG PO TABS: 1.0000 | ORAL_TABLET | ORAL | 0 refills | Status: AC | PRN

## 2023-07-23 MED ORDER — DOCUSATE SODIUM 100 MG PO CAPS: 100.0000 mg | ORAL_CAPSULE | Freq: Two times a day (BID) | ORAL | 2 refills | Status: AC

## 2023-07-23 SURGICAL SUPPLY — 60 items
ANCHOR NDL 9/16 CIR SZ 8 (NEEDLE) IMPLANT
ANCHOR NEEDLE 9/16 CIR SZ 8 (NEEDLE) IMPLANT
BAG COUNTER SPONGE SURGICOUNT (BAG) IMPLANT
BLADE SHAVER TORPEDO 4X13 (MISCELLANEOUS) ×1 IMPLANT
BLADE SURG SZ11 CARB STEEL (BLADE) ×1 IMPLANT
BURR OVAL 8 FLU 4.0X13 (MISCELLANEOUS) IMPLANT
CANNULA 5.75X7 CRYSTAL CLEAR (CANNULA) IMPLANT
CANNULA ACUFO 5X76 (CANNULA) IMPLANT
CLEANER TIP ELECTROSURG 2X2 (MISCELLANEOUS) IMPLANT
COVER SURGICAL LIGHT HANDLE (MISCELLANEOUS) ×1 IMPLANT
DRAPE FOOT SWITCH (DRAPES) ×2 IMPLANT
DRAPE IMP U-DRAPE 54X76 (DRAPES) ×1 IMPLANT
DRAPE POUCH INSTRU U-SHP 10X18 (DRAPES) ×1 IMPLANT
DRAPE STERI 35X30 U-POUCH (DRAPES) ×1 IMPLANT
DRAPE SURG ORHT 6 SPLT 77X108 (DRAPES) ×2 IMPLANT
DRSG AQUACEL AG ADV 3.5X 4 (GAUZE/BANDAGES/DRESSINGS) IMPLANT
DRSG AQUACEL AG ADV 3.5X 6 (GAUZE/BANDAGES/DRESSINGS) IMPLANT
DURAPREP 26ML APPLICATOR (WOUND CARE) ×1 IMPLANT
DW OUTFLOW CASSETTE/TUBE SET (MISCELLANEOUS) ×1 IMPLANT
ELECT NDL TIP 2.8 STRL (NEEDLE) ×1 IMPLANT
ELECT NEEDLE TIP 2.8 STRL (NEEDLE) ×1 IMPLANT
ELECT REM PT RETURN 15FT ADLT (MISCELLANEOUS) ×1 IMPLANT
FILTER STRAW (MISCELLANEOUS) ×1 IMPLANT
GAUZE PAD ABD 8X10 STRL (GAUZE/BANDAGES/DRESSINGS) IMPLANT
GAUZE SPONGE 4X4 12PLY STRL (GAUZE/BANDAGES/DRESSINGS) IMPLANT
GLOVE BIOGEL PI IND STRL 7.0 (GLOVE) ×1 IMPLANT
GLOVE BIOGEL PI IND STRL 8 (GLOVE) ×1 IMPLANT
GLOVE SURG SS PI 8.0 STRL IVOR (GLOVE) ×2 IMPLANT
GOWN STRL REUS W/ TWL XL LVL3 (GOWN DISPOSABLE) ×2 IMPLANT
KIT BASIN OR (CUSTOM PROCEDURE TRAY) ×1 IMPLANT
KIT TURNOVER KIT A (KITS) ×2 IMPLANT
MANIFOLD NEPTUNE II (INSTRUMENTS) ×1 IMPLANT
NDL HD SCORPION MEGA LOADER (NEEDLE) IMPLANT
NDL SPNL 18GX3.5 QUINCKE PK (NEEDLE) ×1 IMPLANT
NEEDLE SPNL 18GX3.5 QUINCKE PK (NEEDLE) ×1 IMPLANT
PACK SHOULDER (CUSTOM PROCEDURE TRAY) ×1 IMPLANT
PROTECTOR NERVE ULNAR (MISCELLANEOUS) ×1 IMPLANT
RESTRAINT HEAD UNIVERSAL NS (MISCELLANEOUS) ×1 IMPLANT
SLING ARM IMMOBILIZER LRG (SOFTGOODS) IMPLANT
SLING ARM IMMOBILIZER MED (SOFTGOODS) IMPLANT
SLING ULTRA II L (ORTHOPEDIC SUPPLIES) IMPLANT
STRIP CLOSURE SKIN 1/2X4 (GAUZE/BANDAGES/DRESSINGS) IMPLANT
SUCTION TUBE FRAZIER 12FR DISP (SUCTIONS) ×1 IMPLANT
SUT ETHIBOND NAB CT1 #1 30IN (SUTURE) IMPLANT
SUT ETHILON 4 0 PS 2 18 (SUTURE) ×1 IMPLANT
SUT PROLENE 3 0 PS 2 (SUTURE) ×1 IMPLANT
SUT TIGER TAPE 7 IN WHITE (SUTURE) IMPLANT
SUT VIC AB 0 CT1 36 (SUTURE) ×2 IMPLANT
SUT VIC AB 1-0 CT2 27 (SUTURE) IMPLANT
SUT VIC AB 2-0 CT2 27 (SUTURE) IMPLANT
SUT VICRYL 0 UR6 27IN ABS (SUTURE) ×1 IMPLANT
SUTURE FIBERWR #2 38 T-5 BLUE (SUTURE) IMPLANT
SYR 20ML LL LF (SYRINGE) ×1 IMPLANT
TAPE FIBER 2MM 7IN #2 BLUE (SUTURE) IMPLANT
TAPE PAPER 3X10 WHT MICROPORE (GAUZE/BANDAGES/DRESSINGS) IMPLANT
TOWEL OR 17X26 10 PK STRL BLUE (TOWEL DISPOSABLE) ×1 IMPLANT
TUBING ARTHROSCOPY IRRIG 16FT (MISCELLANEOUS) ×1 IMPLANT
TUBING CONNECTING 10 (TUBING) ×1 IMPLANT
WAND ABLATOR APOLLO I90 (BUR) ×1 IMPLANT
WIPE CHG 2% PREP (PERSONAL CARE ITEMS) ×1 IMPLANT

## 2023-07-23 NOTE — Anesthesia Procedure Notes (Signed)
 Anesthesia Regional Block: Interscalene brachial plexus block   Pre-Anesthetic Checklist: , timeout performed,  Correct Patient, Correct Site, Correct Laterality,  Correct Procedure, Correct Position, site marked,  Risks and benefits discussed,  Pre-op evaluation,  At surgeon's request and post-op pain management  Laterality: Left  Prep: Maximum Sterile Barrier Precautions used, chloraprep       Needles:  Injection technique: Single-shot  Needle Type: Echogenic Stimulator Needle     Needle Length: 5cm  Needle Gauge: 22     Additional Needles:   Procedures:,,,, ultrasound used (permanent image in chart),,    Narrative:  Start time: 07/23/2023 10:35 AM End time: 07/23/2023 10:45 AM Injection made incrementally with aspirations every 5 mL.  Performed by: Personally  Anesthesiologist: Jake Mayers, MD  Additional Notes:

## 2023-07-23 NOTE — Transfer of Care (Signed)
 Immediate Anesthesia Transfer of Care Note  Patient: Ryan Simmons  Procedure(s) Performed: ARTHROSCOPY, SHOULDER, WITH ROTATOR CUFF REPAIR (Left)  Patient Location: PACU  Anesthesia Type:General  Level of Consciousness: awake, alert , oriented, and patient cooperative  Airway & Oxygen Therapy: Patient Spontanous Breathing and Patient connected to face mask oxygen  Post-op Assessment: Report given to RN, Post -op Vital signs reviewed and stable, and Patient moving all extremities  Post vital signs: Reviewed and stable  Last Vitals:  Vitals Value Taken Time  BP 147/93 07/23/23 13:38  Temp    Pulse 93 07/23/23 13:42  Resp 19 07/23/23 13:42  SpO2 99 % 07/23/23 13:42  Vitals shown include unfiled device data.  Last Pain:  Vitals:   07/23/23 1045  TempSrc:   PainSc: 0-No pain         Complications: No notable events documented.

## 2023-07-23 NOTE — Anesthesia Procedure Notes (Signed)
 Procedure Name: Intubation Date/Time: 07/23/2023 12:10 PM  Performed by: Marshall Skeeter, CRNAPre-anesthesia Checklist: Patient identified, Emergency Drugs available, Suction available, Patient being monitored and Timeout performed Patient Re-evaluated:Patient Re-evaluated prior to induction Oxygen Delivery Method: Circle system utilized Preoxygenation: Pre-oxygenation with 100% oxygen Induction Type: IV induction Ventilation: Oral airway inserted - appropriate to patient size and Two handed mask ventilation required Laryngoscope Size: Mac and 4 Grade View: Grade I Tube type: Oral Tube size: 7.5 mm Number of attempts: 1 Airway Equipment and Method: Stylet Placement Confirmation: ETT inserted through vocal cords under direct vision, positive ETCO2 and breath sounds checked- equal and bilateral Secured at: 22 cm Tube secured with: Tape Dental Injury: Teeth and Oropharynx as per pre-operative assessment

## 2023-07-23 NOTE — Anesthesia Postprocedure Evaluation (Signed)
 Anesthesia Post Note  Patient: Ryan Simmons  Procedure(s) Performed: ARTHROSCOPY, SHOULDER, WITH ROTATOR CUFF REPAIR (Left)     Patient location during evaluation: PACU Anesthesia Type: General and Regional Level of consciousness: awake and alert Pain management: pain level controlled Vital Signs Assessment: post-procedure vital signs reviewed and stable Respiratory status: spontaneous breathing, nonlabored ventilation and respiratory function stable Cardiovascular status: blood pressure returned to baseline and stable Postop Assessment: no apparent nausea or vomiting Anesthetic complications: no  No notable events documented.  Last Vitals:  Vitals:   07/23/23 1410 07/23/23 1415  BP: (!) 131/94 (!) 134/96  Pulse: 78 79  Resp: 14 17  Temp:    SpO2: 93% 94%    Last Pain:  Vitals:   07/23/23 1400  TempSrc:   PainSc: 0-No pain                 Eriel Dunckel,W. EDMOND

## 2023-07-23 NOTE — Interval H&P Note (Signed)
 History and Physical Interval Note:  07/23/2023 11:07 AM  Ryan Simmons  has presented today for surgery, with the diagnosis of Impingment syndrom, labral tear left shoulder.  The various methods of treatment have been discussed with the patient and family. After consideration of risks, benefits and other options for treatment, the patient has consented to  Procedure(s) with comments: ARTHROSCOPY, SHOULDER, WITH ROTATOR CUFF REPAIR (Left) - Left shoulder arthroscopy ,subacromial decompression, debridement, labrum bicep tendon as a surgical intervention.  The patient's history has been reviewed, patient examined, no change in status, stable for surgery.  I have reviewed the patient's chart and labs.  Questions were answered to the patient's satisfaction.     [name]

## 2023-07-23 NOTE — Anesthesia Preprocedure Evaluation (Addendum)
 Anesthesia Evaluation  Patient identified by MRN, date of birth, ID band Patient awake    Reviewed: Allergy & Precautions, H&P , NPO status , Patient's Chart, lab work & pertinent test results  Airway Mallampati: III  TM Distance: >3 FB Neck ROM: Full    Dental no notable dental hx. (+) Teeth Intact, Dental Advisory Given   Pulmonary neg pulmonary ROS   Pulmonary exam normal breath sounds clear to auscultation       Cardiovascular negative cardio ROS  Rhythm:Regular Rate:Normal     Neuro/Psych negative neurological ROS  negative psych ROS   GI/Hepatic negative GI ROS, Neg liver ROS,,,  Endo/Other    Class 3 obesity  Renal/GU negative Renal ROS  negative genitourinary   Musculoskeletal   Abdominal   Peds  Hematology  (+) Blood dyscrasia, anemia   Anesthesia Other Findings   Reproductive/Obstetrics negative OB ROS                             Anesthesia Physical Anesthesia Plan  ASA: 2  Anesthesia Plan: General   Post-op Pain Management: Regional block* and Tylenol PO (pre-op)*   Induction: Intravenous  PONV Risk Score and Plan: 3 and Ondansetron, Dexamethasone and Midazolam  Airway Management Planned: Oral ETT  Additional Equipment:   Intra-op Plan:   Post-operative Plan: Extubation in OR  Informed Consent: I have reviewed the patients History and Physical, chart, labs and discussed the procedure including the risks, benefits and alternatives for the proposed anesthesia with the patient or authorized representative who has indicated his/her understanding and acceptance.     Dental advisory given  Plan Discussed with: CRNA  Anesthesia Plan Comments:        Anesthesia Quick Evaluation

## 2023-07-23 NOTE — Brief Op Note (Signed)
 07/23/2023  11:07 AM  PATIENT:  Ryan Simmons  48 y.o. male  PRE-OPERATIVE DIAGNOSIS:  Impingment syndrom, labral tear left shoulder  POST-OPERATIVE DIAGNOSIS:  * No post-op diagnosis entered *  PROCEDURE:  Procedure(s) with comments: ARTHROSCOPY, SHOULDER, WITH ROTATOR CUFF REPAIR (Left) - Left shoulder arthroscopy ,subacromial decompression, debridement, labrum bicep tendon  SURGEON:  Surgeons and Role:    Orvan Blanch, MD - Primary  PHYSICIAN ASSISTANT:   ASSISTANTS: Bissell   ANESTHESIA:   spinal  EBL:  50   BLOOD ADMINISTERED:none  DRAINS: LOCAL MEDICATIONS USED:  MARCAINE     SPECIMEN:  No Specimen  DISPOSITION OF SPECIMEN:  N/A  COUNTS:  YES  TOURNIQUET:  * No tourniquets in log *  DICTATION: .Other Dictation: Dictation Number [id number]  PLAN OF CARE: Admit for overnight observation  PATIENT DISPOSITION:  PACU - hemodynamically stable.   Delay start of Pharmacological VTE agent (>24hrs) due to surgical blood loss or risk of bleeding: no

## 2023-07-24 ENCOUNTER — Encounter (HOSPITAL_COMMUNITY): Payer: Self-pay | Admitting: Specialist

## 2023-07-24 ENCOUNTER — Other Ambulatory Visit: Payer: Self-pay

## 2023-07-24 NOTE — Op Note (Signed)
 NAMELARSEN, DUNGAN MEDICAL RECORD NO: [id number] ACCOUNT NO: [id number] DATE OF BIRTH: [birth year] FACILITY: [facility] LOCATION: [facility] PHYSICIAN: [name], MD  Operative Report   DATE OF PROCEDURE: 07/23/2023  PREOPERATIVE DIAGNOSES: 1.  Impingement syndrome, left shoulder. 2.  Glenoid labral tear, left shoulder. 3.  Biceps tendon tear, left shoulder. 4.  Bursitis, left shoulder.  POSTOPERATIVE DIAGNOSES: 1.  Impingement syndrome, left shoulder. 2.  Glenoid labral tear, left shoulder. 3.  Biceps tendon tear, left shoulder. 4.  Bursitis, left shoulder.  PROCEDURES PERFORMED: 1.  Left shoulder arthroscopy. 2.  Extensive ebridement anterior, superior, and posterior labrum, subacromial and subdeltoid bursa,biceps; very likely because of 3.  Chondroplasty of the humeral head and of the acetabulum. 4.  Debridement of biceps tendon stump. 5.  Subacromial decompression, acromioplasty, subdeltoid and subacromial bursectomy with extensive debridement. 6.  Acromioplasty and release of CA ligament.  HISTORY: A 48 year old with impingement syndrome, left shoulder, 2 falls, a glenoid labral tear, biceps tendon tear was noted.  Interstitial tearing of the rotator cuff.  He was indicated for shoulder arthroscopy, subacromial decompression, debridement of labrum.   Risks and benefits were discussed including bleeding, infection, damage to neurovascular structures, no change in symptoms or worsening symptoms, DVT, PE, anesthetic complications, etc.   DESCRIPTION OF PROCEDURE:  The patient was placed in the supine position.  After induction of adequate spinal anesthesia and 3 g of Kefzol left shoulder and upper extremity was prepped and draped in the usual sterile fashion.  Exam under anesthesia  revealed full range.    Surgical marker was utilized to delineate the acromion, AC joint, and coracoid.  A standard posterolateral portal was utilized with an incision through the skin only with  #11 blade with the arm in the 70/30 position.  We advanced the arthroscopic cannula  into the glenohumeral joint, penetrated atraumatically.  This was in line with the coracoid.  Irrigant was utilized to insufflate to 35 mmHg.  Noted was some grade 3 changes of the acetabulum and humeral head.  There was extensive tearing of the  anterior, superior, and posterior labrum.  It was not detached but eroded.  There was an absence of biceps tendon, with a small stump residual.  Under direct visualization, I fashioned an anterior portal localization with an 18-gauge needle midway  between the coracoid and the anterolateral aspect of the acromion.  I then introduced the cannula penetrating it traumatically.  I introduced a shaver and debrided the anterior, superior, and posterior labrum to a stable base, further contoured with an  ArthroWand.  Chondroplasty of the humeral head and of the acetabulum was performed as well due to grade three changes of the humeral head and glenoid..  I debrided the biceps tendon stump as no biceps tendon was noted attaching to the labrum and not noted exiting the bicipital groove.  Rotator cuff tendon was intact without evidence of tear.  Synovitis was debrided as well.  The subscapularis was  unremarkable.  We probed the labrum.  It was stable.  I redirected the camera in the subacromial space.  I fashioned the third portal anterolaterally with an incision through the skin only, triangulating the subacromial space.  Irrigant was utilized to insufflate  to 35 mmHg.  There was exuberant hypertrophic bursa, subdeltoid, subacromial, and a full bursectomy was performed.  I then released the CA ligament with an ArthroWand, preserving the deltoid attachment.  I then shaved a spur off the anterolateral aspect of the acromion.  There was  no further impingement was noted.  I examined the entire cuff.  There was no evidence of a significant tear.  This was probed as well.  Cautery was utilized  to achieve hemostasis and morselize the CA ligament.  Copiously  lavaged the joint.   All instrumentation was removed, closed the portals with 4-0 nylon simple sutures and 0.25% Marcaine with epinephrine was infiltrated in the joint.  The wound was dressed sterilely.  Woken without difficulty and transported to the recovery room in  satisfactory condition.    The patient tolerated the procedure well.  No complications.  Assistant Amy Kansky, [place] was used throughout the case for patient positioning, applying traction to the arm in the 70/30 position for access to the glenohumeral joint and the 0/30-degree position to gain access to the subacromial space.  Sterile dressings were applied, placed in a sling, extubated, and transported to the recovery room in satisfactory condition.  The patient tolerated the procedure well with no complications.  No blood loss.    MUK D: 07/23/2023 1:24:06 pm T: 07/24/2023 12:26:00 am  JOB: [id number]/ [id number]

## 2023-10-31 ENCOUNTER — Encounter: Payer: Self-pay | Admitting: Gastroenterology

## 2023-10-31 ENCOUNTER — Encounter
Admission: RE | Disposition: A | Discharge status: Home / Self Care | Payer: Self-pay | Source: Home / Self Care | Attending: Gastroenterology

## 2023-10-31 ENCOUNTER — Ambulatory Visit
Admission: RE | Admit: 2023-10-31 | Discharge: 2023-10-31 | Disposition: A | Discharge status: Home / Self Care | Attending: Gastroenterology | Admitting: Gastroenterology

## 2023-10-31 ENCOUNTER — Ambulatory Visit: Admitting: Certified Registered"

## 2023-10-31 DIAGNOSIS — Z1211 Encounter for screening for malignant neoplasm of colon: Secondary | ICD-10-CM | POA: Insufficient documentation

## 2023-10-31 DIAGNOSIS — K5730 Diverticulosis of large intestine without perforation or abscess without bleeding: Secondary | ICD-10-CM | POA: Insufficient documentation

## 2023-10-31 DIAGNOSIS — K635 Polyp of colon: Secondary | ICD-10-CM | POA: Diagnosis not present

## 2023-10-31 HISTORY — PX: POLYPECTOMY: SHX149

## 2023-10-31 HISTORY — PX: COLONOSCOPY: SHX5424

## 2023-10-31 SURGERY — COLONOSCOPY
Anesthesia: General

## 2023-10-31 MED ORDER — PROPOFOL 500 MG/50ML IV EMUL
INTRAVENOUS | Status: DC | PRN
  Administered 2023-10-31: 175 ug/kg/min via INTRAVENOUS

## 2023-10-31 MED ORDER — PROPOFOL 10 MG/ML IV BOLUS
INTRAVENOUS | Status: DC | PRN
  Administered 2023-10-31: 100 mg via INTRAVENOUS

## 2023-10-31 MED ORDER — LIDOCAINE HCL (CARDIAC) PF 100 MG/5ML IV SOSY
PREFILLED_SYRINGE | INTRAVENOUS | Status: DC | PRN
  Administered 2023-10-31: 50 mg via INTRAVENOUS

## 2023-10-31 MED ORDER — SODIUM CHLORIDE 0.9 % IV SOLN
INTRAVENOUS | Status: DC
  Administered 2023-10-31: 20 mL/h via INTRAVENOUS

## 2023-10-31 MED ORDER — PHENYLEPHRINE 80 MCG/ML (10ML) SYRINGE FOR IV PUSH (FOR BLOOD PRESSURE SUPPORT)
PREFILLED_SYRINGE | INTRAVENOUS | Status: DC | PRN
  Administered 2023-10-31: 160 ug via INTRAVENOUS
  Administered 2023-10-31 (×2): 80 ug via INTRAVENOUS

## 2023-10-31 NOTE — Op Note (Signed)
 [facility] Gastroenterology Patient Name: Ryan Simmons Procedure Date: 10/31/2023 9:14 AM MRN: [id number] Account #: [id number] Date of Birth: 27-Mar-1975 Admit Type: Outpatient Age: 48 Room: [facility] ENDO ROOM 2 Gender: Male Note Status: Finalized Instrument Name: Colon Scope [id number] Procedure:             Colonoscopy Indications:           Screening for colorectal malignant neoplasm Providers:             Ruel Kung MD, MD Referring MD:          Alm Needle (Referring MD) Medicines:             Monitored Anesthesia Care Complications:         No immediate complications. Procedure:             Pre-Anesthesia Assessment:                        - Prior to the procedure, a History and Physical was                         performed, and patient medications, allergies and                         sensitivities were reviewed. The patient's tolerance                         of previous anesthesia was reviewed.                        - The risks and benefits of the procedure and the                         sedation options and risks were discussed with the                         patient. All questions were answered and informed                         consent was obtained.                        - ASA Grade Assessment: II - A patient with mild                         systemic disease.                        After obtaining informed consent, the colonoscope was                         passed under direct vision. Throughout the procedure,                         the patient's blood pressure, pulse, and oxygen                         saturations were monitored continuously. The                         Colonoscope was introduced  through the anus and                         advanced to the the cecum, identified by the                         appendiceal orifice. The colonoscopy was performed                         with ease. The patient tolerated the procedure well.                          The quality of the bowel preparation was excellent.                         The ileocecal valve, appendiceal orifice, and rectum                         were photographed. Findings:      The perianal and digital rectal examinations were normal.      A 5 mm polyp was found in the sigmoid colon. The polyp was sessile. The       polyp was removed with a cold snare. Resection and retrieval were       complete.      Two sessile polyps were found in the cecum. The polyps were 2 to 3 mm in       size. These polyps were removed with a jumbo cold forceps. Resection and       retrieval were complete.      Multiple medium-mouthed diverticula were found in the entire colon.      The exam was otherwise without abnormality on direct and retroflexion       views. Impression:            - One 5 mm polyp in the sigmoid colon, removed with a                         cold snare. Resected and retrieved.                        - Two 2 to 3 mm polyps in the cecum, removed with a                         jumbo cold forceps. Resected and retrieved.                        - Diverticulosis in the entire examined colon.                        - The examination was otherwise normal on direct and                         retroflexion views. Recommendation:        - Discharge patient to home (with escort).                        - Resume previous diet.                        -  Continue present medications.                        - Await pathology results.                        - Repeat colonoscopy date to be determined after                         pending pathology results are reviewed for                         surveillance. Procedure Code(s):     --- Professional ---                        [id number], Colonoscopy, flexible; with removal of                         tumor(s), polyp(s), or other lesion(s) by snare                         technique                        45380, 59, Colonoscopy,  flexible; with biopsy, single                         or multiple Diagnosis Code(s):     --- Professional ---                        Z12.11, Encounter for screening for malignant neoplasm                         of colon                        D12.5, Benign neoplasm of sigmoid colon                        K57.30, Diverticulosis of large intestine without                         perforation or abscess without bleeding                        D12.0, Benign neoplasm of cecum CPT copyright 2022 American Medical Association. All rights reserved. The codes documented in this report are preliminary and upon coder review may  be revised to meet current compliance requirements. Ruel Kung, MD Ruel Kung MD, MD 10/31/2023 9:35:03 AM This report has been signed electronically. Number of Addenda: 0 Note Initiated On: 10/31/2023 9:14 AM Scope Withdrawal Time: 0 hours 10 minutes 37 seconds  Total Procedure Duration: 0 hours 14 minutes 24 seconds  Estimated Blood Loss:  Estimated blood loss: none.      [facility]

## 2023-10-31 NOTE — Anesthesia Postprocedure Evaluation (Signed)
 Anesthesia Post Note  Patient: Ryan Simmons  Procedure(s) Performed: COLONOSCOPY POLYPECTOMY, INTESTINE  Patient location during evaluation: PACU Anesthesia Type: General Level of consciousness: awake Pain management: satisfactory to patient Vital Signs Assessment: post-procedure vital signs reviewed and stable Respiratory status: spontaneous breathing Cardiovascular status: stable Anesthetic complications: no   No notable events documented.   Last Vitals:  Vitals:   10/31/23 0947 10/31/23 0948  BP: (!) 83/45 115/70  Pulse: 63 88  Resp: 15 16  Temp:    SpO2: 97% 99%    Last Pain:  Vitals:   10/31/23 0948  TempSrc:   PainSc: 0-No pain                 VAN STAVEREN,Jannah Guardiola

## 2023-10-31 NOTE — Transfer of Care (Signed)
 Immediate Anesthesia Transfer of Care Note  Patient: KYHEEM BATHGATE  Procedure(s) Performed: COLONOSCOPY POLYPECTOMY, INTESTINE  Patient Location: Endoscopy Unit  Anesthesia Type:General  Level of Consciousness: drowsy and patient cooperative  Airway & Oxygen Therapy: Patient Spontanous Breathing and Patient connected to nasal cannula oxygen  Post-op Assessment: Report given to RN, Post -op Vital signs reviewed and stable, and Patient moving all extremities X 4  Post vital signs: Reviewed and stable  Last Vitals:  Vitals Value Taken Time  BP 90/47 10/31/23 09:37  Temp    Pulse 66 10/31/23 09:37  Resp 12 10/31/23 09:37  SpO2 97 % 10/31/23 09:37    Last Pain:  Vitals:   10/31/23 0937  TempSrc:   PainSc: Asleep         Complications: No notable events documented.

## 2023-10-31 NOTE — H&P (Signed)
 Ruel Kung , MD [address], [place], [place], [address] Phone: [phone] Fax: [phone]  Primary Care Physician:  Epifanio Alm SQUIBB, MD   Pre-Procedure History & Physical: HPI:  Ryan Simmons is a 48 y.o. male is here for an colonoscopy.   Past Medical History:  Diagnosis Date   Allergy    Anemia    Gout    History of ITP    Hyperlipemia    Palpitation    [place] spotted fever     Past Surgical History:  Procedure Laterality Date   COLONOSCOPY WITH PROPOFOL  N/A 07/25/2017   Procedure: COLONOSCOPY WITH PROPOFOL ;  Surgeon: Janalyn Keene NOVAK, MD;  Location: [facility] ENDOSCOPY;  Service: Endoscopy;  Laterality: N/A;   ESOPHAGOGASTRODUODENOSCOPY (EGD) WITH PROPOFOL  N/A 07/25/2017   Procedure: ESOPHAGOGASTRODUODENOSCOPY (EGD) WITH PROPOFOL ;  Surgeon: Janalyn Keene NOVAK, MD;  Location: [facility] ENDOSCOPY;  Service: Endoscopy;  Laterality: N/A;   KNEE SURGERY Left    arthroscopy   SHOULDER ARTHROSCOPY WITH ROTATOR CUFF REPAIR Left 07/23/2023   Procedure: ARTHROSCOPY, SHOULDER, WITH ROTATOR CUFF REPAIR;  Surgeon: Duwayne Purchase, MD;  Location: [facility] ORS;  Service: Orthopedics;  Laterality: Left;  Left shoulder arthroscopy ,subacromial decompression, debridement, labrum bicep tendon   SHOULDER SURGERY Right    arthroscopy   SPLENECTOMY  1984   Age 39    Prior to Admission medications   Medication Sig Start Date End Date Taking? Authorizing Provider  aspirin  EC 81 MG tablet Take 1 tablet (81 mg total) by mouth 2 (two) times daily after a meal. Day after surgery 07/23/23   Duwayne Purchase, MD  docusate sodium  (COLACE) 100 MG capsule Take 1 capsule (100 mg total) by mouth 2 (two) times daily. 07/23/23 07/22/24  Duwayne Purchase, MD  methocarbamol  (ROBAXIN ) 750 MG tablet Take 1 tablet (750 mg total) by mouth every 8 (eight) hours as needed for  muscle spasms (muscle spasms). 07/23/23   Duwayne Purchase, MD  oxyCODONE -acetaminophen  (PERCOCET) 10-325 MG tablet Take 1 tablet by mouth every 4 (four) hours as needed for pain (severe). 07/23/23   Duwayne Purchase, MD  polyethylene glycol (MIRALAX  / GLYCOLAX ) 17 g packet Take 17 g by mouth daily. 07/23/23   Duwayne Purchase, MD    Allergies as of 10/08/2023   (No Known Allergies)    Family History  Problem Relation Age of Onset   Gout Father    Heart disease Father        atrial fib   Healthy Mother    Healthy Brother     Social History   Socioeconomic History   Marital status: Married    Spouse name: Not on file   Number of children: 0   Years of education: 16   Highest education level: Bachelor's degree (e.g., BA, AB, BS)  Occupational History    Employer: [employer]  Tobacco Use   Smoking status: Never   Smokeless tobacco: Current    Types: Chew  Vaping Use   Vaping status: Never Used  Substance and Sexual Activity   Alcohol use: Yes    Alcohol/week: 1.0 - 2.0 standard drink of alcohol    Types: 1 - 2 Cans of beer per week    Comment: daily   Drug use: Never   Sexual activity: Yes    Partners: Female  Other Topics Concern   Not on file  Social History Narrative   [place]ives in [place]; in [place]; lives with wife. Never smoked; ocassional alcohol.    Social  Drivers of Corporate investment banker Strain: Low Risk  (09/17/2023)   Received from [facility]   Overall Financial Resource Strain (CARDIA)    Difficulty of Paying Living Expenses: Not hard at all  Food Insecurity: No Food Insecurity (09/17/2023)   Received [facility]   Hunger Vital Sign    Within the past 12 months, you worried that your food would run out before you got the money to buy more.: Never true    Within the past 12 months, the food you bought just didn't last and you didn't have money to get more.: Never true  Transportation Needs: No  Transportation Needs (09/17/2023)   Received from [facility] - Transportation    In the past 12 months, has lack of transportation kept you from medical appointments or from getting medications?: No    Lack of Transportation (Non-Medical): No  Physical Activity: Not on file  Stress: Not on file  Social Connections: Not on file  Intimate Partner Violence: Not At Risk (07/05/2022)   Humiliation, Afraid, Rape, and Kick questionnaire    Fear of Current or Ex-Partner: No    Emotionally Abused: No    Physically Abused: No    Sexually Abused: No    Review of Systems: See HPI, otherwise negative ROS  Physical Exam: There were no vitals taken for this visit. General:   Alert,  pleasant and cooperative in NAD Head:  Normocephalic and atraumatic. Neck:  Supple; no masses or thyromegaly. Lungs:  Clear throughout to auscultation, normal respiratory effort.    Heart:  +S1, +S2, Regular rate and rhythm, No edema. Abdomen:  Soft, nontender and nondistended. Normal bowel sounds, without guarding, and without rebound.   Neurologic:  Alert and  oriented x4;  grossly normal neurologically.  Impression/Plan: Ryan Simmons is here for an colonoscopy to be performed for Screening colonoscopy average risk   Risks, benefits, limitations, and alternatives regarding  colonoscopy have been reviewed with the patient.  Questions have been answered.  All parties agreeable.   Ruel Kung, MD  10/31/2023, 8:40 AM

## 2023-10-31 NOTE — Anesthesia Preprocedure Evaluation (Signed)
 Anesthesia Evaluation  Patient identified by MRN, date of birth, ID band Patient awake    Reviewed: Allergy & Precautions, NPO status , Patient's Chart, lab work & pertinent test results  Airway Mallampati: II  TM Distance: >3 FB Neck ROM: full    Dental  (+) Teeth Intact   Pulmonary neg pulmonary ROS   Pulmonary exam normal        Cardiovascular Exercise Tolerance: Good negative cardio ROS Normal cardiovascular exam Rhythm:Regular Rate:Normal     Neuro/Psych negative neurological ROS  negative psych ROS   GI/Hepatic negative GI ROS, Neg liver ROS,,,  Endo/Other  negative endocrine ROS    Renal/GU negative Renal ROS  negative genitourinary   Musculoskeletal negative musculoskeletal ROS (+)    Abdominal Normal abdominal exam  (+)   Peds negative pediatric ROS (+)  Hematology negative hematology ROS (+) Blood dyscrasia, anemia   Anesthesia Other Findings Past Medical History: No date: Allergy No date: Anemia No date: Gout No date: History of ITP No date: Hyperlipemia No date: Palpitation No date: [facility] spotted fever  Past Surgical History: 07/25/2017: COLONOSCOPY WITH PROPOFOL ; N/A     Comment:  Procedure: COLONOSCOPY WITH PROPOFOL ;  Surgeon:               Janalyn Keene NOVAK, MD;  Location: [facility] ENDOSCOPY;                Service: Endoscopy;  Laterality: N/A; 07/25/2017: ESOPHAGOGASTRODUODENOSCOPY (EGD) WITH PROPOFOL ; N/A     Comment:  Procedure: ESOPHAGOGASTRODUODENOSCOPY (EGD) WITH               PROPOFOL ;  Surgeon: Janalyn Keene NOVAK, MD;  Location:               [facility] END[facility]COPY;  Service: Endoscopy;  Laterality: N/A; No date: KNEE SURGERY; Left     Comment:  arthroscopy 07/23/2023: SHOULDER ARTHROSCOPY WITH ROTATOR CUFF REPAIR; Left     Comment:  Procedure: ARTHROSCOPY, SHOULDER, WITH ROTATOR CUFF               REPAIR;  Surgeon: Duwayne Purchase, MD;  Location: [facility]S;               Service:  Orthopedics;  Laterality: Left;  Left shoulder               arthroscopy ,subacromial decompression, debridement,               labrum bicep tendon No date: SHOULDER SURGERY; Right     Comment:  arthroscopy 1984: SPLENECTOMY     Comment:  Age 48  BMI    Body Mass Index: 34.54 kg/m      Reproductive/Obstetrics negative OB ROS                              Anesthesia Physical Anesthesia Plan  ASA: 2  Anesthesia Plan: General   Post-op Pain Management:    Induction: Intravenous  PONV Risk Score and Plan: Propofol  infusion and TIVA  Airway Management Planned: Natural Airway and Nasal Cannula  Additional Equipment:   Intra-op Plan:   Post-operative Plan:   Informed Consent: I have reviewed the patients History and Physical, chart, labs and discussed the procedure including the risks, benefits and alternatives for the proposed anesthesia with the patient or authorized representative who has indicated his/her understanding and acceptance.     Dental Advisory Given  Plan Discussed with: CRNA  Anesthesia Plan  Comments:         Anesthesia Quick Evaluation

## 2023-11-04 LAB — SURGICAL PATHOLOGY
